# Patient Record
Sex: Male | Born: 1985 | Race: Black or African American | Hispanic: No | Marital: Single | Smoking: Current some day smoker
Health system: Southern US, Community
[De-identification: ages and names within clinical notes are randomized; demographics above are authoritative.]

## PROBLEM LIST (undated history)

## (undated) DIAGNOSIS — F4310 Post-traumatic stress disorder, unspecified: Secondary | ICD-10-CM

## (undated) DIAGNOSIS — I10 Essential (primary) hypertension: Secondary | ICD-10-CM

## (undated) DIAGNOSIS — R42 Dizziness and giddiness: Secondary | ICD-10-CM

---

## 2005-01-07 ENCOUNTER — Emergency Department (HOSPITAL_COMMUNITY): Admission: EM | Admit: 2005-01-07 | Discharge: 2005-01-08 | Payer: Self-pay | Admitting: Emergency Medicine
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
## 2008-02-27 ENCOUNTER — Emergency Department (HOSPITAL_COMMUNITY): Admission: EM | Admit: 2008-02-27 | Discharge: 2008-02-27 | Payer: Self-pay | Admitting: Emergency Medicine

## 2008-04-01 ENCOUNTER — Emergency Department (HOSPITAL_COMMUNITY): Admission: EM | Admit: 2008-04-01 | Discharge: 2008-04-01 | Payer: Self-pay | Admitting: Emergency Medicine

## 2008-04-18 ENCOUNTER — Emergency Department (HOSPITAL_COMMUNITY): Admission: EM | Admit: 2008-04-18 | Discharge: 2008-04-18 | Payer: Self-pay | Admitting: Emergency Medicine

## 2008-05-07 ENCOUNTER — Emergency Department (HOSPITAL_COMMUNITY): Admission: EM | Admit: 2008-05-07 | Discharge: 2008-05-07 | Payer: Self-pay | Admitting: Emergency Medicine

## 2008-11-21 ENCOUNTER — Emergency Department (HOSPITAL_COMMUNITY): Admission: EM | Admit: 2008-11-21 | Discharge: 2008-11-21 | Payer: Self-pay | Admitting: Family Medicine

## 2008-11-21 ENCOUNTER — Emergency Department (HOSPITAL_COMMUNITY): Admission: EM | Admit: 2008-11-21 | Discharge: 2008-11-21 | Payer: Self-pay | Admitting: Emergency Medicine

## 2009-02-18 ENCOUNTER — Emergency Department (HOSPITAL_COMMUNITY): Admission: EM | Admit: 2009-02-18 | Discharge: 2009-02-18 | Payer: Self-pay | Admitting: Emergency Medicine

## 2009-03-13 ENCOUNTER — Emergency Department (HOSPITAL_COMMUNITY): Admission: EM | Admit: 2009-03-13 | Discharge: 2009-03-13 | Payer: Self-pay | Admitting: Emergency Medicine

## 2009-08-23 ENCOUNTER — Emergency Department (HOSPITAL_COMMUNITY): Admission: EM | Admit: 2009-08-23 | Discharge: 2009-08-23 | Payer: Self-pay | Admitting: Family Medicine

## 2010-02-19 ENCOUNTER — Encounter: Payer: Self-pay | Admitting: Emergency Medicine

## 2010-05-04 LAB — COMPREHENSIVE METABOLIC PANEL
BUN: 10 mg/dL (ref 6–23)
CO2: 28 mEq/L (ref 19–32)
Calcium: 9 mg/dL (ref 8.4–10.5)
Creatinine, Ser: 1.07 mg/dL (ref 0.4–1.5)
GFR calc non Af Amer: >60 mL/min (ref >60)
Glucose, Bld: 86 mg/dL (ref 70–99)
Sodium: 143 mEq/L (ref 135–145)
Total Protein: 7.1 g/dL (ref 6.0–8.3)

## 2010-05-04 LAB — CBC
Hemoglobin: 14.4 g/dL (ref 13.0–17.0)
MCHC: 33.4 g/dL (ref 30.0–36.0)
MCV: 82.6 fL (ref 78.0–100.0)
RBC: 5.24 MIL/uL (ref 4.22–5.81)
RDW: 13.1 % (ref 11.5–15.5)

## 2010-05-04 LAB — DIFFERENTIAL
Eosinophils Absolute: 0 10*3/uL (ref 0.0–0.7)
Lymphocytes Relative: 26 % (ref 12–46)
Lymphs Abs: 1.9 10*3/uL (ref 0.7–4.0)
Monocytes Relative: 7 % (ref 3–12)
Neutro Abs: 4.8 10*3/uL (ref 1.7–7.7)
Neutrophils Relative %: 66 % (ref 43–77)

## 2010-05-04 LAB — LIPASE, BLOOD: Lipase: 13 U/L (ref 11–59)

## 2010-05-11 LAB — COMPREHENSIVE METABOLIC PANEL
ALT: 20 U/L (ref 0–53)
Albumin: 4.2 g/dL (ref 3.5–5.2)
Alkaline Phosphatase: 43 U/L (ref 39–117)
BUN: 13 mg/dL (ref 6–23)
Chloride: 106 mEq/L (ref 96–112)
Glucose, Bld: 95 mg/dL (ref 70–99)
Potassium: 4 mEq/L (ref 3.5–5.1)
Sodium: 141 mEq/L (ref 135–145)
Total Bilirubin: 0.7 mg/dL (ref 0.3–1.2)
Total Protein: 7.1 g/dL (ref 6.0–8.3)

## 2010-05-11 LAB — DIFFERENTIAL
Basophils Absolute: 0 10*3/uL (ref 0.0–0.1)
Basophils Relative: 0 % (ref 0–1)
Eosinophils Absolute: 0 10*3/uL (ref 0.0–0.7)
Monocytes Absolute: 0.5 10*3/uL (ref 0.1–1.0)
Monocytes Relative: 7 % (ref 3–12)
Neutro Abs: 4.9 10*3/uL (ref 1.7–7.7)
Neutrophils Relative %: 68 % (ref 43–77)

## 2010-05-11 LAB — CBC
HCT: 41.2 % (ref 39.0–52.0)
Hemoglobin: 14 g/dL (ref 13.0–17.0)
RDW: 13 % (ref 11.5–15.5)
WBC: 7.2 10*3/uL (ref 4.0–10.5)

## 2010-05-11 LAB — URINALYSIS, ROUTINE W REFLEX MICROSCOPIC
Glucose, UA: NEGATIVE mg/dL
Nitrite: NEGATIVE
Specific Gravity, Urine: 1.034 — ABNORMAL HIGH (ref 1.005–1.030)
pH: 6 (ref 5.0–8.0)

## 2010-05-11 LAB — RAPID URINE DRUG SCREEN, HOSP PERFORMED: Tetrahydrocannabinol: NOT DETECTED

## 2010-05-11 LAB — ETHANOL: Alcohol, Ethyl (B): 10 mg/dL (ref 0–10)

## 2010-12-02 ENCOUNTER — Emergency Department (HOSPITAL_COMMUNITY)

## 2010-12-02 ENCOUNTER — Emergency Department (HOSPITAL_COMMUNITY)
Admission: EM | Admit: 2010-12-02 | Discharge: 2010-12-02 | Disposition: A | Discharge status: Home / Self Care | Attending: Emergency Medicine | Admitting: Emergency Medicine

## 2010-12-02 DIAGNOSIS — S335XXA Sprain of ligaments of lumbar spine, initial encounter: Secondary | ICD-10-CM | POA: Insufficient documentation

## 2010-12-02 DIAGNOSIS — R112 Nausea with vomiting, unspecified: Secondary | ICD-10-CM | POA: Insufficient documentation

## 2010-12-02 DIAGNOSIS — X503XXA Overexertion from repetitive movements, initial encounter: Secondary | ICD-10-CM | POA: Insufficient documentation

## 2010-12-02 DIAGNOSIS — Y93B3 Activity, free weights: Secondary | ICD-10-CM | POA: Insufficient documentation

## 2010-12-02 DIAGNOSIS — M545 Low back pain, unspecified: Secondary | ICD-10-CM | POA: Insufficient documentation

## 2010-12-02 DIAGNOSIS — R197 Diarrhea, unspecified: Secondary | ICD-10-CM | POA: Insufficient documentation

## 2010-12-02 LAB — POCT I-STAT, CHEM 8
BUN: 14 mg/dL (ref 6–23)
Chloride: 104 mEq/L (ref 96–112)
HCT: 46 % (ref 39.0–52.0)
Potassium: 3.6 mEq/L (ref 3.5–5.1)
Sodium: 141 mEq/L (ref 135–145)

## 2010-12-02 LAB — URINALYSIS, ROUTINE W REFLEX MICROSCOPIC
Bilirubin Urine: NEGATIVE
Glucose, UA: NEGATIVE mg/dL
Leukocytes, UA: NEGATIVE
Nitrite: NEGATIVE
Specific Gravity, Urine: 1.012 (ref 1.005–1.030)
pH: 5.5 (ref 5.0–8.0)

## 2010-12-02 LAB — CBC
Hemoglobin: 14.6 g/dL (ref 13.0–17.0)
MCV: 80.8 fL (ref 78.0–100.0)
Platelets: 247 10*3/uL (ref 150–400)
RBC: 5.53 MIL/uL (ref 4.22–5.81)
WBC: 13.5 10*3/uL — ABNORMAL HIGH (ref 4.0–10.5)

## 2010-12-02 LAB — DIFFERENTIAL
Eosinophils Absolute: 0.1 10*3/uL (ref 0.0–0.7)
Lymphocytes Relative: 23 % (ref 12–46)
Lymphs Abs: 3.1 10*3/uL (ref 0.7–4.0)
Neutro Abs: 9.3 10*3/uL — ABNORMAL HIGH (ref 1.7–7.7)
Neutrophils Relative %: 68 % (ref 43–77)

## 2010-12-03 LAB — URINE CULTURE: Culture  Setup Time: 201211031354

## 2011-03-04 ENCOUNTER — Encounter (HOSPITAL_COMMUNITY): Payer: Self-pay | Admitting: Emergency Medicine

## 2011-03-04 ENCOUNTER — Emergency Department (HOSPITAL_COMMUNITY)
Admission: EM | Admit: 2011-03-04 | Discharge: 2011-03-04 | Disposition: A | Discharge status: Home / Self Care | Attending: Emergency Medicine | Admitting: Emergency Medicine

## 2011-03-04 DIAGNOSIS — R197 Diarrhea, unspecified: Secondary | ICD-10-CM | POA: Insufficient documentation

## 2011-03-04 DIAGNOSIS — R112 Nausea with vomiting, unspecified: Secondary | ICD-10-CM | POA: Insufficient documentation

## 2011-03-04 DIAGNOSIS — K5289 Other specified noninfective gastroenteritis and colitis: Secondary | ICD-10-CM | POA: Insufficient documentation

## 2011-03-04 DIAGNOSIS — R109 Unspecified abdominal pain: Secondary | ICD-10-CM | POA: Insufficient documentation

## 2011-03-04 DIAGNOSIS — K529 Noninfective gastroenteritis and colitis, unspecified: Secondary | ICD-10-CM

## 2011-03-04 MED ORDER — PROMETHAZINE HCL 25 MG PO TABS: 25.0000 mg | ORAL_TABLET | Freq: Four times a day (QID) | ORAL | Status: DC | PRN

## 2011-03-04 MED ORDER — SODIUM CHLORIDE 0.9 % IV SOLN: INTRAVENOUS | Status: DC

## 2011-03-04 MED ORDER — SODIUM CHLORIDE 0.9 % IV BOLUS (SEPSIS)
1000.0000 mL | Freq: Once | INTRAVENOUS | Status: AC
  Administered 2011-03-04: 1000 mL via INTRAVENOUS

## 2011-03-04 MED ORDER — ONDANSETRON HCL 4 MG/2ML IJ SOLN
4.0000 mg | Freq: Once | INTRAMUSCULAR | Status: AC
  Administered 2011-03-04: 4 mg via INTRAVENOUS
  Filled 2011-03-04: qty 2

## 2011-03-04 MED ORDER — LOPERAMIDE HCL 2 MG PO TABS: 2.0000 mg | ORAL_TABLET | Freq: Four times a day (QID) | ORAL | Status: AC | PRN

## 2011-03-04 NOTE — ED Notes (Signed)
Pt states understanding of discharge instructions 

## 2011-03-04 NOTE — ED Notes (Signed)
Patient states that he has not been feeling good for the last 2-3 days (i.e. Body aches, headache, cough, nasal congestion); patient reports right sided abdominal pain, nausea, and vomiting since 2300 last night -- patient reports possible blood present in vomit.  Reports normal bowel movements; last bowel movement at 0400.

## 2011-03-04 NOTE — ED Provider Notes (Signed)
History     CSN: [id number]  Arrival date & time 03/04/11  4098   First MD Initiated Contact with Patient 03/04/11 [phone]      Chief Complaint  Patient presents with  . Emesis    (Consider location/radiation/quality/duration/timing/severity/associated sxs/prior treatment) Patient is a 26 y.o. male presenting with vomiting. The history is provided by the patient.  Emesis  This is a new problem. The current episode started 3 to 5 hours ago. The problem occurs 2 to 4 times per day. The problem has not changed since onset.The emesis has an appearance of stomach contents. There has been no fever. Associated symptoms include abdominal pain and diarrhea. Pertinent negatives include no chills, no fever, no headaches and no myalgias. Risk factors include ill contacts.   Patient also with diarrhea about 4 times same onset time as the vomiting which all occurred at 11:00 PM.  History reviewed. No pertinent past medical history.  History reviewed. No pertinent past surgical history.  History reviewed. No pertinent family history.  History  Substance Use Topics  . Smoking status: Not on file  . Smokeless tobacco: Not on file  . Alcohol Use: Not on file      Review of Systems  Constitutional: Negative for fever and chills.  HENT: Negative for neck pain.   Eyes: Negative for redness.  Respiratory: Negative for shortness of breath.   Cardiovascular: Negative for chest pain.  Gastrointestinal: Positive for nausea, vomiting, abdominal pain and diarrhea.  Genitourinary: Negative for dysuria.  Musculoskeletal: Negative for myalgias and back pain.  Neurological: Negative for headaches.    Allergies  Review of patient's allergies indicates no known allergies.  Home Medications   Current Outpatient Rx  Name Route Sig Dispense Refill  . LOPERAMIDE HCL 2 MG PO TABS Oral Take 1 tablet (2 mg total) by mouth 4 (four) times daily as needed for diarrhea or loose stools. 30 tablet 0  .  PROMETHAZINE HCL 25 MG PO TABS Oral Take 1 tablet (25 mg total) by mouth every 6 (six) hours as needed for nausea. 12 tablet 0    BP 112/74  Pulse 50  Temp(Src) 98 F (36.7 C) (Oral)  Resp 18  SpO2 98%  Physical Exam  Nursing note and vitals reviewed. Constitutional: He is oriented to person, place, and time. He appears well-developed and well-nourished.  HENT:  Head: Normocephalic and atraumatic.  Mouth/Throat: Oropharynx is clear and moist.  Eyes: Conjunctivae and EOM are normal. Pupils are equal, round, and reactive to light.  Neck: Normal range of motion. Neck supple.  Cardiovascular: Normal rate, regular rhythm and normal heart sounds.   No murmur heard. Pulmonary/Chest: Effort normal and breath sounds normal. No respiratory distress.  Abdominal: Soft. Bowel sounds are normal. There is no tenderness.  Musculoskeletal: Normal range of motion.  Neurological: He is alert and oriented to person, place, and time. No cranial nerve deficit. He exhibits normal muscle tone. Coordination normal.  Skin: Skin is warm. No rash noted.    ED Course  Procedures (including critical care time)  Labs Reviewed - No data to display No results found.   1. Gastroenteritis       MDM   Clinically symptoms consistent with viral gastroenteritis acute onset of vomiting and diarrhea 3-4 episodes of each. Improved in emergency department with 1 L IV normal saline fluid bolus and Zofran. Recheck of abdomen shows no abdominal tenderness no guarding, doubt acute abdomen  Patient with a sick contact in his neice that had  a similar illness.     Shelda Jakes, MD 03/04/11 [phone]

## 2011-12-19 ENCOUNTER — Encounter (HOSPITAL_COMMUNITY): Payer: Self-pay

## 2011-12-19 ENCOUNTER — Emergency Department (INDEPENDENT_AMBULATORY_CARE_PROVIDER_SITE_OTHER)
Admission: EM | Admit: 2011-12-19 | Discharge: 2011-12-19 | Disposition: A | Discharge status: Home / Self Care | Payer: Self-pay | Source: Home / Self Care | Attending: Emergency Medicine | Admitting: Emergency Medicine

## 2011-12-19 DIAGNOSIS — S239XXA Sprain of unspecified parts of thorax, initial encounter: Secondary | ICD-10-CM

## 2011-12-19 DIAGNOSIS — IMO0002 Reserved for concepts with insufficient information to code with codable children: Secondary | ICD-10-CM

## 2011-12-19 MED ORDER — DICLOFENAC SODIUM 75 MG PO TBEC: 75.0000 mg | DELAYED_RELEASE_TABLET | Freq: Two times a day (BID) | ORAL | Status: DC

## 2011-12-19 MED ORDER — METHOCARBAMOL 500 MG PO TABS: 500.0000 mg | ORAL_TABLET | Freq: Three times a day (TID) | ORAL | Status: DC

## 2011-12-19 MED ORDER — TRAMADOL HCL 50 MG PO TABS: 100.0000 mg | ORAL_TABLET | Freq: Three times a day (TID) | ORAL | Status: DC | PRN

## 2011-12-19 NOTE — ED Provider Notes (Signed)
Chief Complaint  Patient presents with  . Back Pain    History of Present Illness:   The patient is a 26 year old male who has had a one half week history of mid back pain localized just below his left shoulder blade. He denies any injury. 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 The pain is rated 5/10 in intensity. It's worse if she lifts her stretches and better with a hot shower. There is no radiation down the leg, no numbness, tingling, or weakness of the upper or lower extremities. He denies any pain with deep inspiration. He has had no urinary symptoms, dysuria, frequency, or hematuria. There is no incontinence of bladder or bowel. No saddle anesthesia. He denies any fever, weight loss, or history of cancer. There is no shortness of breath, coughing, or pleuritic pain.  Review of Systems:  Other than noted above, the patient denies any of the following symptoms: Systemic:  No fever, chills, severe fatigue, or unexplained weight loss. GI:  No abdominal pain, nausea, vomiting, diarrhea, constipation, incontinence of bowel, or blood in stool. GU:  No dysuria, frequency, urgency, or hematuria. No incontinence of urine or difficulty urinating.  M-S:  No neck pain, joint pain, arthritis, or myalgias. Neuro:  No paresthesias, saddle anesthesia, muscular weakness, or progressive neurological deficit.  PMFSH:  Past medical history, family history, social history, meds, and allergies were reviewed. Specifically, there is no history of cancer, major trauma, osteoporosis, immunosuppression, HIV, or IV or injection drug use.   Physical Exam:   Vital signs:  BP 121/57  Pulse 61  Temp 98.3 F (36.8 C) (Oral)  Resp 10  SpO2 97% General:  Alert, oriented, in no distress. Abdomen:  Soft, non-tender.  No organomegaly or mass.  No pulsatile midline abdominal mass or bruit. Back:  There is mild pain to palpation just below the tip of his left scapula on the lower back. His back  has a full range of motion with slight pain on forward bending. Straight leg raising was negative. Neuro:  Normal muscle strength, sensations and DTRs. Extremities: Pedal pulses were full, there was no edema. Skin:  Clear, warm and dry.  No rash.     Assessment:  The encounter diagnosis was Thoracic sprain and strain.  Plan:   1.  The following meds were prescribed:   New Prescriptions   DICLOFENAC (VOLTAREN) 75 MG EC TABLET    Take 1 tablet (75 mg total) by mouth 2 (two) times daily.   METHOCARBAMOL (ROBAXIN) 500 MG TABLET    Take 1 tablet (500 mg total) by mouth 3 (three) times daily.   TRAMADOL (ULTRAM) 50 MG TABLET    Take 2 tablets (100 mg total) by mouth every 8 (eight) hours as needed for pain.   2.  The patient was instructed in symptomatic care and handouts were given. 3.  The patient was told to return if becoming worse in any way, if no better in 2 weeks, and given some red flag symptoms that would indicate earlier return. 4.  The patient was encouraged to try to be as active as possible and given some exercises to do followed by moist heat.    Reuben Likes, MD 12/19/11 [phone]

## 2011-12-19 NOTE — ED Notes (Addendum)
Pain in mid back x 2 weeks; no known injury; pain worse w movement. Denies UA symptoms, pain unchanged w percussion

## 2012-08-12 ENCOUNTER — Emergency Department (INDEPENDENT_AMBULATORY_CARE_PROVIDER_SITE_OTHER)
Admission: EM | Admit: 2012-08-12 | Discharge: 2012-08-12 | Disposition: A | Discharge status: Home / Self Care | Payer: Self-pay | Source: Home / Self Care | Attending: Emergency Medicine | Admitting: Emergency Medicine

## 2012-08-12 ENCOUNTER — Encounter (HOSPITAL_COMMUNITY): Payer: Self-pay | Admitting: Emergency Medicine

## 2012-08-12 DIAGNOSIS — A088 Other specified intestinal infections: Secondary | ICD-10-CM

## 2012-08-12 DIAGNOSIS — A084 Viral intestinal infection, unspecified: Secondary | ICD-10-CM

## 2012-08-12 HISTORY — DX: Dizziness and giddiness: R42

## 2012-08-12 LAB — CBC WITH DIFFERENTIAL/PLATELET
Basophils Absolute: 0 10*3/uL (ref 0.0–0.1)
Basophils Relative: 0 % (ref 0–1)
Eosinophils Absolute: 0 10*3/uL (ref 0.0–0.7)
Eosinophils Relative: 0 % (ref 0–5)
HCT: 45.4 % (ref 39.0–52.0)
Lymphocytes Relative: 20 % (ref 12–46)
MCHC: 33.7 g/dL (ref 30.0–36.0)
MCV: 79.1 fL (ref 78.0–100.0)
Monocytes Absolute: 0.5 10*3/uL (ref 0.1–1.0)
Platelets: 223 10*3/uL (ref 150–400)
RDW: 12.9 % (ref 11.5–15.5)
WBC: 7.8 10*3/uL (ref 4.0–10.5)

## 2012-08-12 LAB — POCT I-STAT, CHEM 8
Calcium, Ion: 1.2 mmol/L (ref 1.12–1.23)
HCT: 50 % (ref 39.0–52.0)
Hemoglobin: 17 g/dL (ref 13.0–17.0)
Sodium: 142 mEq/L (ref 135–145)
TCO2: 27 mmol/L (ref 0–100)

## 2012-08-12 MED ORDER — GI COCKTAIL ~~LOC~~
30.0000 mL | Freq: Once | ORAL | Status: AC
  Administered 2012-08-12: 30 mL via ORAL

## 2012-08-12 MED ORDER — SODIUM CHLORIDE 0.9 % IV SOLN
INTRAVENOUS | Status: DC
  Administered 2012-08-12: 13:00:00 via INTRAVENOUS

## 2012-08-12 MED ORDER — GI COCKTAIL ~~LOC~~
ORAL | Status: AC
  Filled 2012-08-12: qty 30

## 2012-08-12 MED ORDER — ONDANSETRON 4 MG PO TBDP
8.0000 mg | ORAL_TABLET | Freq: Once | ORAL | Status: AC
  Administered 2012-08-12: 8 mg via ORAL

## 2012-08-12 MED ORDER — ONDANSETRON 8 MG PO TBDP: 8.0000 mg | ORAL_TABLET | Freq: Three times a day (TID) | ORAL | Status: DC | PRN

## 2012-08-12 MED ORDER — OXYCODONE-ACETAMINOPHEN 5-325 MG PO TABS: ORAL_TABLET | ORAL | Status: DC

## 2012-08-12 MED ORDER — ONDANSETRON 4 MG PO TBDP
ORAL_TABLET | ORAL | Status: AC
  Filled 2012-08-12: qty 2

## 2012-08-12 MED ORDER — MECLIZINE HCL 25 MG PO TABS: 25.0000 mg | ORAL_TABLET | Freq: Four times a day (QID) | ORAL | Status: DC

## 2012-08-12 NOTE — ED Notes (Signed)
repositioned and provided pillow

## 2012-08-12 NOTE — ED Provider Notes (Signed)
Chief Complaint:   Chief Complaint  Patient presents with  . Emesis    History of Present Illness:   Tyler Ellison is a 27 year old male who has had a history since yesterday of crampy right upper quadrant abdominal pain, nausea, and vomiting. He vomited 4 times yesterday and twice today. No blood in the vomitus or coffee-ground emesis. The vomitus was somewhat yellow. He had 2 diarrheal stools yesterday and none today. He also feels lightheaded, dizzy, off balance. He feels like his head is spinning this comes and goes. He denies any difficulty hearing or ringing in the ears. No URI symptoms. He's also had some headache and right ear pain. He denies any chest pain or shortness of breath. He has had a history of vertigo in the past. No suspicious ingestions.  Review of Systems:  Other than noted above, the patient denies any of the following symptoms: Systemic:  No fevers, chills, sweats, weight loss or gain, fatigue, or tiredness. ENT:  No nasal congestion, rhinorrhea, or sore throat. Lungs:  No cough, wheezing, or shortness of breath. Cardiac:  No chest pain, syncope, or presyncope. GI:  No abdominal pain, nausea, vomiting, anorexia, diarrhea, constipation, blood in stool or vomitus. GU:  No dysuria, frequency, or urgency.  PMFSH:  Past medical history, family history, social history, meds, and allergies were reviewed.   Physical Exam:   Vital signs:  BP 125/87  Pulse 78  Temp(Src) 98.1 F (36.7 C) (Oral)  Resp 20  SpO2 97% Filed Vitals:   08/12/12 1150 Supine  08/12/12 1151 Sitting  08/12/12 1153 Standing   BP: 122/62 122/86 125/87  Pulse: 57 72 78  Temp: 98.1 F (36.7 C)    TempSrc: Oral    Resp: 20    SpO2: 97%      General:  Alert and oriented.  In no distress.  Skin warm and dry.  Good skin turgor, brisk capillary refill. ENT:  No scleral icterus, moist mucous membranes, no oral lesions, pharynx clear. Lungs:  Breath sounds clear and equal bilaterally.  No wheezes,  rales, or rhonchi. Heart:  Rhythm regular, without extrasystoles.  No gallops or murmers. Abdomen:  Soft, flat, nondistended. He has slight right upper quadrant tenderness to palpation without guarding or rebound. No organomegaly or mass. Murphy sign and Murphy's punch were negative. Bowel sounds are hyperactive. Skin: Clear, warm, and dry.  Good turgor.  Brisk capillary refill.  Labs:   Results for orders placed during the hospital encounter of 08/12/12  CBC WITH DIFFERENTIAL      Result Value Range   WBC 7.8  4.0 - 10.5 K/uL   RBC 5.74  4.22 - 5.81 MIL/uL   Hemoglobin 15.3  13.0 - 17.0 g/dL   HCT 16.1  09.6 - 04.5 %   MCV 79.1  78.0 - 100.0 fL   MCH 26.7  26.0 - 34.0 pg   MCHC 33.7  30.0 - 36.0 g/dL   RDW 40.9  81.1 - 91.4 %   Platelets 223  150 - 400 K/uL   Neutrophils Relative % 73  43 - 77 %   Neutro Abs 5.7  1.7 - 7.7 K/uL   Lymphocytes Relative 20  12 - 46 %   Lymphs Abs 1.6  0.7 - 4.0 K/uL   Monocytes Relative 6  3 - 12 %   Monocytes Absolute 0.5  0.1 - 1.0 K/uL   Eosinophils Relative 0  0 - 5 %   Eosinophils Absolute 0.0  0.0 -  0.7 K/uL   Basophils Relative 0  0 - 1 %   Basophils Absolute 0.0  0.0 - 0.1 K/uL  POCT I-STAT, CHEM 8      Result Value Range   Sodium 142  135 - 145 mEq/L   Potassium 4.0  3.5 - 5.1 mEq/L   Chloride 101  96 - 112 mEq/L   BUN 13  6 - 23 mg/dL   Creatinine, Ser 2.72  0.50 - 1.35 mg/dL   Glucose, Bld 95  70 - 99 mg/dL   Calcium, Ion 5.36  1.12 - 1.23 mmol/L   TCO2 27  0 - 100 mmol/L   Hemoglobin 17.0  13.0 - 17.0 g/dL   HCT 64.4  03.4 - 74.2 %     Course in Urgent Care Center:   Since he was mildly orthostatic, was given normal saline 1 L over about 45 minutes. Also given Zofran ODT 8 mg sublingually and a GI cocktail. Thereafter he felt better and his pain was almost completely gone. Given some Gatorade by mouth and was able to tolerate this well.  Assessment:  The encounter diagnosis was Viral gastroenteritis.  Appears to have  a viral  gastroenteritis with mild dehydration as demonstrated by some postural increase in his pulse. He felt better after IV fluids and Zofran. He is able to tolerate oral liquids well.  Plan:   1.  The following meds were prescribed:   Discharge Medication List as of 08/12/2012  1:57 PM    START taking these medications   Details  meclizine (ANTIVERT) 25 MG tablet Take 1 tablet (25 mg total) by mouth 4 (four) times daily., Starting 08/12/2012, Until Discontinued, Normal    ondansetron (ZOFRAN ODT) 8 MG disintegrating tablet Take 1 tablet (8 mg total) by mouth every 8 (eight) hours as needed for nausea., Starting 08/12/2012, Until Discontinued, Normal    oxyCODONE-acetaminophen (PERCOCET) 5-325 MG per tablet 1 to 2 tablets every 6 hours as needed for pain., Print       2.  The patient was instructed in symptomatic care and handouts were given. 3.  The patient was told to return if becoming worse in any way, if no better in 2 or 3 days, and given some red flag symptoms such as persistent vomiting or increasing pain that would indicate earlier return. 4.  The patient was told to take only sips of clear liquids for the next 24 hours and then advance to a b.r.a.t. Diet. 5.  Follow up here if necessary.      Reuben Likes, MD 08/12/12 [phone]

## 2012-08-12 NOTE — ED Notes (Signed)
Vomiting, abdominal pain, lightheaded and dizziness.  Onset of symptoms yesterday morning.  4 epoisodes of vomiting yesterday, 2 episodes today.

## 2012-08-12 NOTE — ED Notes (Signed)
Desiree, cma attempted iv twice, unsuccessful

## 2012-08-27 ENCOUNTER — Encounter (HOSPITAL_COMMUNITY): Payer: Self-pay | Admitting: Emergency Medicine

## 2012-08-27 ENCOUNTER — Emergency Department (INDEPENDENT_AMBULATORY_CARE_PROVIDER_SITE_OTHER)
Admission: EM | Admit: 2012-08-27 | Discharge: 2012-08-27 | Disposition: A | Discharge status: Home / Self Care | Payer: Self-pay | Source: Home / Self Care

## 2012-08-27 DIAGNOSIS — S335XXA Sprain of ligaments of lumbar spine, initial encounter: Secondary | ICD-10-CM

## 2012-08-27 DIAGNOSIS — S39012A Strain of muscle, fascia and tendon of lower back, initial encounter: Secondary | ICD-10-CM

## 2012-08-27 LAB — POCT URINALYSIS DIP (DEVICE)
Bilirubin Urine: NEGATIVE
Glucose, UA: NEGATIVE mg/dL
Hgb urine dipstick: NEGATIVE
Leukocytes, UA: NEGATIVE
Nitrite: NEGATIVE
Urobilinogen, UA: 0.2 mg/dL (ref 0.0–1.0)

## 2012-08-27 MED ORDER — TRAMADOL HCL 50 MG PO TABS: 50.0000 mg | ORAL_TABLET | Freq: Four times a day (QID) | ORAL | Status: DC | PRN

## 2012-08-27 MED ORDER — NAPROXEN 500 MG PO TABS: 500.0000 mg | ORAL_TABLET | Freq: Two times a day (BID) | ORAL | Status: DC

## 2012-08-27 NOTE — ED Notes (Signed)
Pt c/o lower back pain x 2 days. Pt reports he works out a lot and plays basketball. Feels like he may have pulled a muscle. Has been using advil for pain with mild relief. Feels better with stretching. Pt denies urinary problems. Bowel movements are normal. Pt is alert and oriented.

## 2012-08-27 NOTE — ED Provider Notes (Signed)
Medical screening examination/treatment/procedure(s) were performed by non-physician practitioner and as supervising physician I was immediately available for consultation/collaboration.  Raynald Blend, MD 08/27/12 [phone]

## 2012-08-27 NOTE — ED Provider Notes (Signed)
CSN: [id number]     Arrival date & time 08/27/12  1017 History     First MD Initiated Contact with Patient 08/27/12 1111     Chief Complaint  Patient presents with  . Back Pain   (Consider location/radiation/quality/duration/timing/severity/associated sxs/prior Treatment) HPI Comments: 27 year old male presents with pain in the right low back for "a few days". He arose from the couch and experienced moderate pain running down the right posterior back. The pain is localized to the lower thoracic and lumbar para musculature. The pain is not radiate down the hip of the leg. He describes it as mild and tolerable. He has been applying heat and has received massages which have helped. He states nothing makes it worse. He is able to be in school, lift, and demonstrate full range of motion of the spine with minimal to no discomfort. Forward flexion actually helped her to feel better. Stretching helps her to feel better. Denies problems with erection, urination,  incontinence, or extremity weakness or paresthesias. He is lying reclined on the table with relaxed posturing and playing on the telephone.    Past Medical History  Diagnosis Date  . Vertigo    History reviewed. No pertinent past surgical history. No family history on file. History  Substance Use Topics  . Smoking status: Current Every Day Smoker  . Smokeless tobacco: Not on file  . Alcohol Use: Yes    Review of Systems  Constitutional: Positive for activity change. Negative for fever and fatigue.  HENT: Negative.   Respiratory: Negative.   Gastrointestinal: Negative.   Genitourinary: Negative.   Musculoskeletal:       As per HPI  Skin: Negative.   Neurological: Negative for dizziness, weakness, numbness and headaches.    Allergies  Review of patient's allergies indicates no known allergies.  Home Medications   Current Outpatient Rx  Name  Route  Sig  Dispense  Refill  . diclofenac (VOLTAREN) 75 MG EC tablet   Oral  Take 1 tablet (75 mg total) by mouth 2 (two) times daily.   20 tablet   0   . ibuprofen (ADVIL,MOTRIN) 200 MG tablet   Oral   Take 200 mg by mouth every 6 (six) hours as needed for pain.         . meclizine (ANTIVERT) 25 MG tablet   Oral   Take 1 tablet (25 mg total) by mouth 4 (four) times daily.   28 tablet   0   . methocarbamol (ROBAXIN) 500 MG tablet   Oral   Take 1 tablet (500 mg total) by mouth 3 (three) times daily.   30 tablet   0   . naproxen (NAPROSYN) 500 MG tablet   Oral   Take 1 tablet (500 mg total) by mouth 2 (two) times daily.   20 tablet   0   . ondansetron (ZOFRAN ODT) 8 MG disintegrating tablet   Oral   Take 1 tablet (8 mg total) by mouth every 8 (eight) hours as needed for nausea.   20 tablet   0   . oxyCODONE-acetaminophen (PERCOCET) 5-325 MG per tablet      1 to 2 tablets every 6 hours as needed for pain.   20 tablet   0   . traMADol (ULTRAM) 50 MG tablet   Oral   Take 2 tablets (100 mg total) by mouth every 8 (eight) hours as needed for pain.   30 tablet   0   . traMADol (ULTRAM) 50 MG  tablet   Oral   Take 1 tablet (50 mg total) by mouth every 6 (six) hours as needed for pain.   15 tablet   0    BP 133/86  Pulse 60  Temp(Src) 97.2 F (36.2 C) (Oral)  Resp 14  SpO2 100% Physical Exam  Nursing note and vitals reviewed. Constitutional: He is oriented to person, place, and time. He appears well-developed and well-nourished. No distress.  HENT:  Head: Normocephalic and atraumatic.  Eyes: EOM are normal.  Neck: Normal range of motion. Neck supple.  Cardiovascular: Normal rate and normal heart sounds.   Pulmonary/Chest: Effort normal and breath sounds normal. No respiratory distress. He has no wheezes. He has no rales.  Musculoskeletal: Normal range of motion. He exhibits no edema.  Minor tenderness to the right lower parathoracic and paralumbar musculature. No swelling, discoloration or palpated muscular contractions representing  spasms.  Neurological: He is alert and oriented to person, place, and time. No cranial nerve deficit. He exhibits normal muscle tone.  Skin: Skin is warm and dry.  Psychiatric: He has a normal mood and affect.    ED Course   Procedures (including critical care time)  Labs Reviewed  POCT URINALYSIS DIP (DEVICE)   No results found. 1. Lumbar strain, initial encounter     MDM  Weight lifting for a couple weeks. Perform stretches as discussed. Continue to apply heat as directed. Ultram 50 milligrams Q6 hours when necessary pain Naprosyn EC 500 mg twice a day when necessary   Hayden Rasmussen, NP 08/27/12 1148

## 2012-09-01 ENCOUNTER — Emergency Department (INDEPENDENT_AMBULATORY_CARE_PROVIDER_SITE_OTHER)
Admission: EM | Admit: 2012-09-01 | Discharge: 2012-09-01 | Disposition: A | Discharge status: Home / Self Care | Payer: Self-pay | Source: Home / Self Care | Attending: Emergency Medicine | Admitting: Emergency Medicine

## 2012-09-01 ENCOUNTER — Encounter (HOSPITAL_COMMUNITY): Payer: Self-pay | Admitting: Emergency Medicine

## 2012-09-01 ENCOUNTER — Other Ambulatory Visit (HOSPITAL_COMMUNITY)
Admission: RE | Admit: 2012-09-01 | Discharge: 2012-09-01 | Disposition: A | Discharge status: Home / Self Care | Payer: Self-pay | Source: Ambulatory Visit | Attending: Emergency Medicine | Admitting: Emergency Medicine

## 2012-09-01 DIAGNOSIS — A088 Other specified intestinal infections: Secondary | ICD-10-CM

## 2012-09-01 DIAGNOSIS — R300 Dysuria: Secondary | ICD-10-CM

## 2012-09-01 DIAGNOSIS — S39012D Strain of muscle, fascia and tendon of lower back, subsequent encounter: Secondary | ICD-10-CM

## 2012-09-01 DIAGNOSIS — Z113 Encounter for screening for infections with a predominantly sexual mode of transmission: Secondary | ICD-10-CM | POA: Insufficient documentation

## 2012-09-01 LAB — POCT URINALYSIS DIP (DEVICE)
Hgb urine dipstick: NEGATIVE
Ketones, ur: NEGATIVE mg/dL
Protein, ur: NEGATIVE mg/dL
Specific Gravity, Urine: 1.02 (ref 1.005–1.030)
Urobilinogen, UA: 0.2 mg/dL (ref 0.0–1.0)

## 2012-09-01 MED ORDER — AZITHROMYCIN 250 MG PO TABS
ORAL_TABLET | ORAL | Status: AC
  Filled 2012-09-01: qty 4

## 2012-09-01 MED ORDER — CYCLOBENZAPRINE HCL 5 MG PO TABS: 5.0000 mg | ORAL_TABLET | Freq: Three times a day (TID) | ORAL | Status: DC | PRN

## 2012-09-01 MED ORDER — LIDOCAINE HCL (PF) 1 % IJ SOLN
INTRAMUSCULAR | Status: AC
  Filled 2012-09-01: qty 5

## 2012-09-01 MED ORDER — CEFTRIAXONE SODIUM 250 MG IJ SOLR
INTRAMUSCULAR | Status: AC
  Filled 2012-09-01: qty 250

## 2012-09-01 MED ORDER — CEFTRIAXONE SODIUM 250 MG IJ SOLR
250.0000 mg | Freq: Once | INTRAMUSCULAR | Status: AC
  Administered 2012-09-01: 250 mg via INTRAMUSCULAR

## 2012-09-01 MED ORDER — AZITHROMYCIN 250 MG PO TABS
1000.0000 mg | ORAL_TABLET | Freq: Once | ORAL | Status: AC
  Administered 2012-09-01: 1000 mg via ORAL

## 2012-09-01 MED ORDER — OXYCODONE-ACETAMINOPHEN 5-325 MG PO TABS: ORAL_TABLET | ORAL | Status: DC

## 2012-09-01 MED ORDER — MELOXICAM 15 MG PO TABS: 15.0000 mg | ORAL_TABLET | Freq: Every day | ORAL | Status: DC

## 2012-09-01 NOTE — ED Notes (Signed)
C/o continuing back from 7/30. C/o discomfort with urination onset yesterday AM- woke from sleep to urinate and felt pain. He noted blood in his urine today.  No penile discharge. Has noted wetness on his underwear but thought it was urine.

## 2012-09-01 NOTE — ED Provider Notes (Signed)
Chief Complaint:   Chief Complaint  Patient presents with  . Back Pain    History of Present Illness:   Tyler Ellison is a 27 year old male who was seen here about 5 days ago for lower back pain. He had gotten up from a couch, and experienced pain in the lower back without radiation he was given muscle relaxers, anti-inflammatories, and pain meds, but states today he is no better. The pain does not radiate. There is no numbness, tingling, weakness in lower extremities. It's located in the lower back bilaterally. It radiates around the sides and he's had some stomach cramps. Also today he had dysuria and noted that possibly some brown spots in his urine. He felt somewhat chilled and nauseated and vomited once. He's had several loose stools. He denies any urethral discharge.  Review of Systems:  Other than noted above, the patient denies any of the following symptoms: Systemic:  No fever, chills, severe fatigue, or unexplained weight loss. GI:  No abdominal pain, nausea, vomiting, diarrhea, constipation, incontinence of bowel, or blood in stool. GU:  No dysuria, frequency, urgency, or hematuria. No incontinence of urine or difficulty urinating.  M-S:  No neck pain, joint pain, arthritis, or myalgias. Neuro:  No paresthesias, saddle anesthesia, muscular weakness, or progressive neurological deficit.  PMFSH:  Past medical history, family history, social history, meds, and allergies were reviewed. Specifically, there is no history of cancer, major trauma, osteoporosis, immunosuppression, or HIV infection.   Physical Exam:   Vital signs:  BP 134/73  Pulse 70  Temp(Src) 98.3 F (36.8 C) (Oral)  Resp 14  SpO2 100% General:  Alert, oriented, in no distress. Lungs: Clear to auscultation. Heart: Regular rhythm no gallop or murmur. Abdomen:  Soft, with mild generalized tenderness to palpation without guarding or rebound, bowel sounds are normally active.  No organomegaly or mass.  No pulsatile midline  abdominal mass or bruit. Genital exam: No urethral discharge, no penile lesions or testicular tenderness. No ankle adenopathy or hernia. Back:  He has mild CVA tenderness and mild lumbar paravertebral and midline tenderness to palpation. His back has a limited range of motion with pain. Straight leg raising was negative. Neuro:  Normal muscle strength, sensations and DTRs. Extremities: Pedal pulses were full, there was no edema. Skin:  Clear, warm and dry.  No rash.  Labs:   Results for orders placed during the hospital encounter of 09/01/12  POCT URINALYSIS DIP (DEVICE)      Result Value Range   Glucose, UA NEGATIVE  NEGATIVE mg/dL   Bilirubin Urine NEGATIVE  NEGATIVE   Ketones, ur NEGATIVE  NEGATIVE mg/dL   Specific Gravity, Urine 1.020  1.005 - 1.030   Hgb urine dipstick NEGATIVE  NEGATIVE   pH 6.0  5.0 - 8.0   Protein, ur NEGATIVE  NEGATIVE mg/dL   Urobilinogen, UA 0.2  0.0 - 1.0 mg/dL   Nitrite NEGATIVE  NEGATIVE   Leukocytes, UA NEGATIVE  NEGATIVE    Urine culture was obtained as well as DNA probes for gonorrhea, Chlamydia, and Trichomonas.  Course in Urgent Care Center:   Was given Rocephin 250 mg IM and azithromycin 1000 mg by mouth.  Assessment:  The primary encounter diagnosis was Dysuria. A diagnosis of Lumbar strain, subsequent encounter was also pertinent to this visit.  There is no evidence that the back pain is being caused by any kidney or urinary problem, particularly no evidence of kidney stones, UTI, or pyelonephritis. I think he just has a back  strain. It's only been 5 days. I think is just going to take more time to get better. I suggest he followup with an orthopedist, Dr. Victorino Dike within the next week.  Dysuria maybe urethritis. Therefore we'll go ahead and treat with Rocephin and azithromycin. DNA probe results are pending.  Finally, he has nausea, vomiting, and diarrhea, possibly a viral gastroenteritis. Doubt that this is related either to the back problem or  his urinary symptoms.  Plan:   1.  The following meds were prescribed:   Discharge Medication List as of 09/01/2012  8:22 PM    START taking these medications   Details  cyclobenzaprine (FLEXERIL) 5 MG tablet Take 1 tablet (5 mg total) by mouth 3 (three) times daily as needed for muscle spasms., Starting 09/01/2012, Until Discontinued, Normal    meloxicam (MOBIC) 15 MG tablet Take 1 tablet (15 mg total) by mouth daily., Starting 09/01/2012, Until Discontinued, Normal       Also given a prescription for Percocet 5/325, #20, 1-2 every 6 hours as needed.  2.  The patient was instructed in symptomatic care and handouts were given. 3.  The patient was told to return if becoming worse in any way, if no better in 2 weeks, and given some red flag symptoms including worsening pain, fever, or new neurological symptoms or any difficulty urinating, persistent vomiting, or persistent diarrhea that would indicate earlier return. 4.  The patient was encouraged to try to be as active as possible and given some exercises to do followed by moist heat. 5.  Follow up with Dr. Toni Arthurs within the next week.    Reuben Likes, MD 09/01/12 2137

## 2012-09-03 LAB — URINE CULTURE
Colony Count: NO GROWTH
Culture: NO GROWTH

## 2012-09-07 ENCOUNTER — Encounter (HOSPITAL_COMMUNITY): Payer: Self-pay | Admitting: *Deleted

## 2012-09-07 ENCOUNTER — Emergency Department (HOSPITAL_COMMUNITY)
Admission: EM | Admit: 2012-09-07 | Discharge: 2012-09-08 | Disposition: A | Discharge status: Home / Self Care | Payer: Self-pay | Attending: Emergency Medicine | Admitting: Emergency Medicine

## 2012-09-07 DIAGNOSIS — Z79899 Other long term (current) drug therapy: Secondary | ICD-10-CM | POA: Insufficient documentation

## 2012-09-07 DIAGNOSIS — F17200 Nicotine dependence, unspecified, uncomplicated: Secondary | ICD-10-CM | POA: Insufficient documentation

## 2012-09-07 DIAGNOSIS — S9030XA Contusion of unspecified foot, initial encounter: Secondary | ICD-10-CM | POA: Insufficient documentation

## 2012-09-07 DIAGNOSIS — IMO0002 Reserved for concepts with insufficient information to code with codable children: Secondary | ICD-10-CM | POA: Insufficient documentation

## 2012-09-07 DIAGNOSIS — Z8669 Personal history of other diseases of the nervous system and sense organs: Secondary | ICD-10-CM | POA: Insufficient documentation

## 2012-09-07 DIAGNOSIS — S9031XA Contusion of right foot, initial encounter: Secondary | ICD-10-CM

## 2012-09-07 DIAGNOSIS — T07XXXA Unspecified multiple injuries, initial encounter: Secondary | ICD-10-CM

## 2012-09-07 DIAGNOSIS — Y9289 Other specified places as the place of occurrence of the external cause: Secondary | ICD-10-CM | POA: Insufficient documentation

## 2012-09-07 DIAGNOSIS — Y9389 Activity, other specified: Secondary | ICD-10-CM | POA: Insufficient documentation

## 2012-09-07 NOTE — ED Notes (Signed)
Pt states that he was opening his door of his car and slipped, his right leg brushed against the tire and caused abrassions down his shin, ankle and foot.

## 2012-09-08 ENCOUNTER — Emergency Department (HOSPITAL_COMMUNITY): Payer: Self-pay

## 2012-09-08 MED ORDER — HYDROCODONE-ACETAMINOPHEN 5-325 MG PO TABS: 2.0000 | ORAL_TABLET | ORAL | Status: DC | PRN

## 2012-09-08 MED ORDER — BACITRACIN-NEOMYCIN-POLYMYXIN OINTMENT TUBE
TOPICAL_OINTMENT | Freq: Once | CUTANEOUS | Status: AC
  Administered 2012-09-08: 01:00:00 via TOPICAL
  Filled 2012-09-08: qty 15
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
## 2012-09-08 NOTE — ED Provider Notes (Signed)
Medical screening examination/treatment/procedure(s) were performed by non-physician practitioner and as supervising physician I was immediately available for consultation/collaboration.   Hanley Seamen, MD 09/08/12 [phone]

## 2012-09-09 ENCOUNTER — Emergency Department (HOSPITAL_COMMUNITY): Payer: Self-pay

## 2012-09-09 ENCOUNTER — Encounter (HOSPITAL_COMMUNITY): Payer: Self-pay | Admitting: Emergency Medicine

## 2012-09-09 ENCOUNTER — Emergency Department (HOSPITAL_COMMUNITY)
Admission: EM | Admit: 2012-09-09 | Discharge: 2012-09-09 | Disposition: A | Discharge status: Home / Self Care | Payer: Self-pay | Attending: Emergency Medicine | Admitting: Emergency Medicine

## 2012-09-09 DIAGNOSIS — IMO0002 Reserved for concepts with insufficient information to code with codable children: Secondary | ICD-10-CM | POA: Insufficient documentation

## 2012-09-09 DIAGNOSIS — S93699A Other sprain of unspecified foot, initial encounter: Secondary | ICD-10-CM | POA: Insufficient documentation

## 2012-09-09 DIAGNOSIS — Y9389 Activity, other specified: Secondary | ICD-10-CM | POA: Insufficient documentation

## 2012-09-09 DIAGNOSIS — S93601A Unspecified sprain of right foot, initial encounter: Secondary | ICD-10-CM

## 2012-09-09 DIAGNOSIS — F17200 Nicotine dependence, unspecified, uncomplicated: Secondary | ICD-10-CM | POA: Insufficient documentation

## 2012-09-09 DIAGNOSIS — Y9289 Other specified places as the place of occurrence of the external cause: Secondary | ICD-10-CM | POA: Insufficient documentation

## 2012-09-09 DIAGNOSIS — S90811D Abrasion, right foot, subsequent encounter: Secondary | ICD-10-CM

## 2012-09-09 DIAGNOSIS — W108XXA Fall (on) (from) other stairs and steps, initial encounter: Secondary | ICD-10-CM | POA: Insufficient documentation

## 2012-09-09 NOTE — Progress Notes (Signed)
[facility] provided patient with a [facility] Orange Card application and a list of primary care resources.  °
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
## 2012-09-10 NOTE — ED Provider Notes (Signed)
Medical screening examination/treatment/procedure(s) were performed by non-physician practitioner and as supervising physician I was immediately available for consultation/collaboration.  Shawnia Vizcarrondo R. Jenin Birdsall, MD 09/10/12 0650 

## 2012-09-30 ENCOUNTER — Encounter (HOSPITAL_COMMUNITY): Payer: Self-pay | Admitting: Emergency Medicine

## 2012-09-30 ENCOUNTER — Emergency Department (INDEPENDENT_AMBULATORY_CARE_PROVIDER_SITE_OTHER)
Admission: EM | Admit: 2012-09-30 | Discharge: 2012-09-30 | Disposition: A | Discharge status: Home / Self Care | Payer: Self-pay | Source: Home / Self Care

## 2012-09-30 DIAGNOSIS — M545 Low back pain: Secondary | ICD-10-CM

## 2012-09-30 MED ORDER — MELOXICAM 15 MG PO TABS: 15.0000 mg | ORAL_TABLET | Freq: Every day | ORAL | Status: DC | PRN

## 2012-09-30 MED ORDER — CYCLOBENZAPRINE HCL 10 MG PO TABS: 10.0000 mg | ORAL_TABLET | Freq: Every evening | ORAL | Status: DC | PRN

## 2012-09-30 NOTE — ED Provider Notes (Signed)
Tyler Ellison is a 27 y.o. male who presents to Urgent Care today for low back pain starting about 5 days ago worsening over the weekend. 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 He notes pain in his bilateral low back. The pain sometimes radiates to the right posterior thigh. He denies any radiation the level of the knees. He denies any weakness numbness bowel bladder dysfunction or difficulty walking. He's tried some ibuprofen which has helped. Additionally shaking that which has helped. He denies any injury.    PMH reviewed. Healthy otherwise History  Substance Use Topics  . Smoking status: Current Some Day Smoker    Types: Cigarettes  . Smokeless tobacco: Not on file  . Alcohol Use: Yes     Comment: occasionally   ROS as above Medications reviewed. No current facility-administered medications for this encounter.   Current Outpatient Prescriptions  Medication Sig Dispense Refill  . cyclobenzaprine (FLEXERIL) 10 MG tablet Take 1 tablet (10 mg total) by mouth at bedtime as needed for muscle spasms.  30 tablet  0  . meloxicam (MOBIC) 15 MG tablet Take 1 tablet (15 mg total) by mouth daily as needed for pain.  30 tablet  0    Exam:  BP 124/68  Pulse 74  Temp(Src) 98 F (36.7 C) (Oral)  Resp 16  SpO2 99% Gen: Well NAD HEENT: EOMI,  MMM Lungs: CTABL Nl WOB Heart: RRR no MRG Abd: NABS, NT, ND Exts: Non edematous BL  LE, warm and well perfused.  Back: Nontender to spinal midline. Tender to palpation bilateral paraspinals and the lumbar area. Tender to palpation bilateral SI joints. Negative straight leg raise test and Faber test.  Strength is intact bilateral lower extremities. Patient can stand on heels and toes and squat down. Sensation and capillary refill are intact distally. Normal gait.  No results found for this or any previous visit (from the past 24 hour(s)). No results found.  Assessment and Plan: 27 y.o. male with lumbago.  Symptomatic  management with meloxicam and Flexeril heating pad. Work note for 3 days Followup with Dr. Katrinka Blazing as needed. Discussed warning signs or symptoms. Please see discharge instructions. Patient expresses understanding.      Rodolph Bong, MD 09/30/12 1005

## 2012-09-30 NOTE — ED Notes (Signed)
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

## 2012-12-16 ENCOUNTER — Encounter (HOSPITAL_COMMUNITY): Payer: Self-pay | Admitting: Emergency Medicine

## 2012-12-16 ENCOUNTER — Emergency Department (HOSPITAL_COMMUNITY)
Admission: EM | Admit: 2012-12-16 | Discharge: 2012-12-16 | Disposition: A | Discharge status: Home / Self Care | Payer: Self-pay | Attending: Emergency Medicine | Admitting: Emergency Medicine

## 2012-12-16 DIAGNOSIS — Z8669 Personal history of other diseases of the nervous system and sense organs: Secondary | ICD-10-CM | POA: Insufficient documentation

## 2012-12-16 DIAGNOSIS — R1084 Generalized abdominal pain: Secondary | ICD-10-CM | POA: Insufficient documentation

## 2012-12-16 DIAGNOSIS — R112 Nausea with vomiting, unspecified: Secondary | ICD-10-CM | POA: Insufficient documentation

## 2012-12-16 DIAGNOSIS — R109 Unspecified abdominal pain: Secondary | ICD-10-CM

## 2012-12-16 DIAGNOSIS — F17200 Nicotine dependence, unspecified, uncomplicated: Secondary | ICD-10-CM | POA: Insufficient documentation

## 2012-12-16 LAB — COMPREHENSIVE METABOLIC PANEL
ALT: 14 U/L (ref 0–53)
Alkaline Phosphatase: 51 U/L (ref 39–117)
CO2: 27 mEq/L (ref 19–32)
Chloride: 103 mEq/L (ref 96–112)
GFR calc Af Amer: >90 mL/min (ref >90)
GFR calc non Af Amer: 89 mL/min — ABNORMAL LOW (ref >90)
Glucose, Bld: 115 mg/dL — ABNORMAL HIGH (ref 70–99)
Potassium: 3.7 mEq/L (ref 3.5–5.1)
Sodium: 139 mEq/L (ref 135–145)

## 2012-12-16 LAB — URINALYSIS, ROUTINE W REFLEX MICROSCOPIC
Bilirubin Urine: NEGATIVE
Hgb urine dipstick: NEGATIVE
Specific Gravity, Urine: 1.025 (ref 1.005–1.030)
Urobilinogen, UA: 0.2 mg/dL (ref 0.0–1.0)

## 2012-12-16 LAB — CBC WITH DIFFERENTIAL/PLATELET
Lymphocytes Relative: 20 % (ref 12–46)
Lymphs Abs: 1.6 10*3/uL (ref 0.7–4.0)
Neutrophils Relative %: 73 % (ref 43–77)
Platelets: 232 10*3/uL (ref 150–400)
RBC: 5.6 MIL/uL (ref 4.22–5.81)
WBC: 7.7 10*3/uL (ref 4.0–10.5)

## 2012-12-16 MED ORDER — PROMETHAZINE HCL 25 MG PO TABS: 25.0000 mg | ORAL_TABLET | Freq: Four times a day (QID) | ORAL | Status: DC | PRN

## 2012-12-16 NOTE — ED Provider Notes (Signed)
Medical screening examination/treatment/procedure(s) were performed by non-physician practitioner and as supervising physician I was immediately available for consultation/collaboration.  EKG Interpretation   None         Guliana Weyandt L Nickie Deren, MD 12/16/12 1644 

## 2012-12-16 NOTE — ED Notes (Signed)
Patient tolerating PO fluids 

## 2012-12-16 NOTE — ED Notes (Signed)
Patient given water to drink.  

## 2012-12-16 NOTE — ED Notes (Signed)
Patient states that he had a stomach virus with onset 0200 last night.  States that he had stomach cramps, nausea, vomiting, denies diarrhea. Denies blood in vomitus.

## 2012-12-16 NOTE — ED Notes (Signed)
Pt reports abdominal pain and vomiting x 1 since last night.

## 2012-12-16 NOTE — ED Provider Notes (Signed)
CSN: [id number]     Arrival date & time 12/16/12  1047 History   First MD Initiated Contact with Patient 12/16/12 1129     Chief Complaint  Patient presents with  . Abdominal Pain   (Consider location/radiation/quality/duration/timing/severity/associated sxs/prior Treatment) HPI Comments: Patient presents today with a chief complaint of nausea, vomiting, and abdominal pain.  He reports that last evening he began having generalized abdominal cramping, which was intermittent throughout the night.  He denies any abdominal pain at this time.  He states that he had one episode of vomiting last evening.  He denies nausea at this time.  He has not taken anything for symptoms prior to arrival.  Denies diarrhea.  Last BM was yesterday, which he reports was normal.  No blood in emesis or blood in stool.  Denies fever or chills.  He reports that many people at his work have similar symptoms.    The history is provided by the patient.    Past Medical History  Diagnosis Date  . Vertigo    History reviewed. No pertinent past surgical history. No family history on file. History  Substance Use Topics  . Smoking status: Current Some Day Smoker    Types: Cigarettes  . Smokeless tobacco: Not on file  . Alcohol Use: Yes     Comment: occasionally    Review of Systems  Gastrointestinal: Positive for nausea, vomiting and abdominal pain. Negative for diarrhea.  All other systems reviewed and are negative.    Allergies  Review of patient's allergies indicates no known allergies.  Home Medications  No current outpatient prescriptions on file. BP 142/89  Pulse 60  Temp(Src) 97.6 F (36.4 C) (Oral)  Resp 20  SpO2 98% Physical Exam  Nursing note and vitals reviewed. Constitutional: He appears well-developed and well-nourished.  HENT:  Head: Normocephalic and atraumatic.  Mouth/Throat: Oropharynx is clear and moist.  Neck: Normal range of motion. Neck supple.  Cardiovascular: Normal rate,  regular rhythm and normal heart sounds.   Pulmonary/Chest: Effort normal and breath sounds normal.  Abdominal: Soft. Bowel sounds are normal. He exhibits no distension and no mass. There is no tenderness. There is no rebound and no guarding.  Neurological: He is alert.  Skin: Skin is warm and dry.  Psychiatric: He has a normal mood and affect.    ED Course  Procedures (including critical care time) Labs Review Labs Reviewed  COMPREHENSIVE METABOLIC PANEL - Abnormal; Notable for the following:    Glucose, Bld 115 (*)    GFR calc non Af Amer 89 (*)    All other components within normal limits  CBC WITH DIFFERENTIAL  LIPASE, BLOOD  URINALYSIS, ROUTINE W REFLEX MICROSCOPIC   Imaging Review No results found.  EKG Interpretation   None       MDM  No diagnosis found. Patient presenting with a chief complaint of abdominal pain, nausea, and vomiting.  Symptoms began last evening.  No abdominal pain or nausea upon arrival in the ED.  Abdomen soft and nontender on exam.  Patient afebrile.  Labs unremarkable.  Patient tolerating PO liquids.  Patient stable for discharge.  Patient discharged home with Rx for Zofran.   Return precautions given.    Santiago Glad, PA-C 12/16/12 [phone]

## 2013-05-05 ENCOUNTER — Emergency Department (INDEPENDENT_AMBULATORY_CARE_PROVIDER_SITE_OTHER)
Admission: EM | Admit: 2013-05-05 | Discharge: 2013-05-05 | Disposition: A | Discharge status: Home / Self Care | Payer: BC Managed Care – PPO | Source: Home / Self Care | Attending: Family Medicine | Admitting: Family Medicine

## 2013-05-05 ENCOUNTER — Encounter (HOSPITAL_COMMUNITY): Payer: Self-pay | Admitting: Emergency Medicine

## 2013-05-05 DIAGNOSIS — R197 Diarrhea, unspecified: Secondary | ICD-10-CM

## 2013-05-05 MED ORDER — ONDANSETRON 4 MG PO TBDP
ORAL_TABLET | ORAL | Status: AC
  Filled 2013-05-05: qty 1

## 2013-05-05 MED ORDER — ONDANSETRON 4 MG PO TBDP: 4.0000 mg | ORAL_TABLET | Freq: Three times a day (TID) | ORAL | Status: DC | PRN

## 2013-05-05 MED ORDER — DIPHENOXYLATE-ATROPINE 2.5-0.025 MG PO TABS: 2.0000 | ORAL_TABLET | Freq: Four times a day (QID) | ORAL | Status: DC | PRN

## 2013-05-05 MED ORDER — ONDANSETRON 4 MG PO TBDP
4.0000 mg | ORAL_TABLET | Freq: Once | ORAL | Status: AC
  Administered 2013-05-05: 4 mg via ORAL

## 2013-05-05 NOTE — ED Notes (Signed)
Sipping gatorade, tolerating well

## 2013-05-05 NOTE — ED Provider Notes (Signed)
Medical screening examination/treatment/procedure(s) were performed by resident physician or non-physician practitioner and as supervising physician I was immediately available for consultation/collaboration.   Remy Voiles DOUGLAS MD.   Desiraye Rolfson D Phyliss Hulick, MD 05/05/13 1357 

## 2013-05-05 NOTE — ED Notes (Signed)
General body aches, headache, nausea, vomiting, diarrhea symptoms started Monday.  Reports vomiting x 1 today, diarrhea x 3 today.  Patient thinks this is related to some meat he prepared on Sunday.  Patient reports he cannot eat anything, when asked what he had tried reports eating bread and hotdog pieces.

## 2013-05-05 NOTE — ED Notes (Signed)
Patient requested boss be called.  Patient gave Channing MuttersRoy [phone] as contact person.  Patient stood next to this nurse while phone call made.  Instructed nurse ok to tell boss why he was at [facility].

## 2013-05-05 NOTE — ED Provider Notes (Signed)
CSN: [id number]     Arrival date & time 05/05/13  0847 History   First MD Initiated Contact with Patient 05/05/13 [phone]     Chief Complaint  Patient presents with  . Emesis  . Diarrhea   (Consider location/radiation/quality/duration/timing/severity/associated sxs/prior Treatment) Patient is a 28 y.o. male presenting with vomiting. The history is provided by the patient. No language interpreter was used.  Emesis Severity:  Moderate Timing:  Constant Progression:  Worsening Chronicity:  New Recent urination:  Normal Relieved by:  Nothing Worsened by:  Nothing tried Ineffective treatments:  None tried Associated symptoms: diarrhea   Risk factors: no sick contacts   Pt reports he has had vomitting and diarrhea since yesterday.  No fever,  Pt think he ate some meat that he had let sit out to long   Past Medical History  Diagnosis Date  . Vertigo    History reviewed. No pertinent past surgical history. No family history on file. History  Substance Use Topics  . Smoking status: Current Some Day Smoker    Types: Cigarettes  . Smokeless tobacco: Not on file  . Alcohol Use: Yes     Comment: occasionally    Review of Systems  Gastrointestinal: Positive for vomiting and diarrhea.  All other systems reviewed and are negative.    Allergies  Review of patient's allergies indicates no known allergies.  Home Medications   Current Outpatient Rx  Name  Route  Sig  Dispense  Refill  . ibuprofen (ADVIL,MOTRIN) 200 MG tablet   Oral   Take 200 mg by mouth every 6 (six) hours as needed.         Marland Kitchen. EXPIRED: promethazine (PHENERGAN) 25 MG tablet   Oral   Take 1 tablet (25 mg total) by mouth every 6 (six) hours as needed for nausea.   12 tablet   0   . promethazine (PHENERGAN) 25 MG tablet   Oral   Take 1 tablet (25 mg total) by mouth every 6 (six) hours as needed for nausea.   20 tablet   0    There were no vitals taken for this visit. Physical Exam  Nursing note and vitals  reviewed. Constitutional: He is oriented to person, place, and time. He appears well-developed and well-nourished.  HENT:  Head: Normocephalic.  Right Ear: External ear normal.  Left Ear: External ear normal.  Nose: Nose normal.  Mouth/Throat: Oropharynx is clear and moist.  Eyes: Conjunctivae and EOM are normal. Pupils are equal, round, and reactive to light.  Neck: Normal range of motion.  Cardiovascular: Normal rate.   Pulmonary/Chest: Effort normal and breath sounds normal.  Abdominal: Soft. He exhibits no distension.  Musculoskeletal: Normal range of motion.  Neurological: He is alert and oriented to person, place, and time.  Psychiatric: He has a normal mood and affect.    ED Course  Procedures (including critical care time) Labs Review Labs Reviewed - No data to display Imaging Review No results found.  Pt has had several episodes of diarrhea.   Pt is nauseated.  Pt given zofran.    Pt able to tolerate po fluids.   MDM   1. Diarrhea    zofran odt.  Lomotil.   Drink plent of fluids     Tyler Ellison, [place] JerseyPA-C 05/05/13 1019

## 2013-05-05 NOTE — Discharge Instructions (Signed)
Diarrhea Diarrhea is frequent loose and watery bowel movements. It can cause you to feel weak and dehydrated. Dehydration can cause you to become tired and thirsty, have a dry mouth, and have decreased urination that often is dark yellow. Diarrhea is a sign of another problem, most often an infection that will not last long. In most cases, diarrhea typically lasts 2 3 days. However, it can last longer if it is a sign of something more serious. It is important to treat your diarrhea as directed by your caregive to lessen or prevent future episodes of diarrhea. CAUSES  Some common causes include:  Gastrointestinal infections caused by viruses, bacteria, or parasites.  Food poisoning or food allergies.  Certain medicines, such as antibiotics, chemotherapy, and laxatives.  Artificial sweeteners and fructose.  Digestive disorders. HOME CARE INSTRUCTIONS  Ensure adequate fluid intake (hydration): have 1 cup (8 oz) of fluid for each diarrhea episode. Avoid fluids that contain simple sugars or sports drinks, fruit juices, whole milk products, and sodas. Your urine should be clear or pale yellow if you are drinking enough fluids. Hydrate with an oral rehydration solution that you can purchase at pharmacies, retail stores, and online. You can prepare an oral rehydration solution at home by mixing the following ingredients together:    tsp table salt.   tsp baking soda.   tsp salt substitute containing potassium chloride.  1  tablespoons sugar.  1 L (34 oz) of water.  Certain foods and beverages may increase the speed at which food moves through the gastrointestinal (GI) tract. These foods and beverages should be avoided and include:  Caffeinated and alcoholic beverages.  High-fiber foods, such as raw fruits and vegetables, nuts, seeds, and whole grain breads and cereals.  Foods and beverages sweetened with sugar alcohols, such as xylitol, sorbitol, and mannitol.  Some foods may be well  tolerated and may help thicken stool including:  Starchy foods, such as rice, toast, pasta, low-sugar cereal, oatmeal, grits, baked potatoes, crackers, and bagels.  Bananas.  Applesauce.  Add probiotic-rich foods to help increase healthy bacteria in the GI tract, such as yogurt and fermented milk products.  Wash your hands well after each diarrhea episode.  Only take over-the-counter or prescription medicines as directed by your caregiver.  Take a warm bath to relieve any burning or pain from frequent diarrhea episodes. SEEK IMMEDIATE MEDICAL CARE IF:   You are unable to keep fluids down.  You have persistent vomiting.  You have blood in your stool, or your stools are black and tarry.  You do not urinate in 6 8 hours, or there is only a small amount of very dark urine.  You have abdominal pain that increases or localizes.  You have weakness, dizziness, confusion, or lightheadedness.  You have a severe headache.  Your diarrhea gets worse or does not get better.  You have a fever or persistent symptoms for more than 2 3 days.  You have a fever and your symptoms suddenly get worse. MAKE SURE YOU:   Understand these instructions.  Will watch your condition.  Will get help right away if you are not doing well or get worse. Document Released: 01/05/2002 Document Revised: 01/02/2012 Document Reviewed: 09/23/2011 [employer] Patient Information 2014 [employer].  

## 2013-06-30 ENCOUNTER — Encounter (HOSPITAL_COMMUNITY): Payer: Self-pay | Admitting: Emergency Medicine

## 2013-06-30 ENCOUNTER — Emergency Department (HOSPITAL_COMMUNITY)
Admission: EM | Admit: 2013-06-30 | Discharge: 2013-06-30 | Disposition: A | Discharge status: Home / Self Care | Payer: BC Managed Care – PPO | Attending: Emergency Medicine | Admitting: Emergency Medicine

## 2013-06-30 DIAGNOSIS — Z0000 Encounter for general adult medical examination without abnormal findings: Secondary | ICD-10-CM

## 2013-06-30 DIAGNOSIS — R112 Nausea with vomiting, unspecified: Secondary | ICD-10-CM | POA: Insufficient documentation

## 2013-06-30 DIAGNOSIS — F17200 Nicotine dependence, unspecified, uncomplicated: Secondary | ICD-10-CM | POA: Insufficient documentation

## 2013-06-30 NOTE — ED Notes (Signed)
Per pt sts he had some N,V from 5 am to 7 am today. sts he is feeling better not. Sts he has drank 2 Gatorades and eaten some crackers and feels fine. Denies any pain. sts that yesterday he spent all day at the pool and didn't eat much and drank some alcohol.

## 2013-06-30 NOTE — ED Provider Notes (Signed)
CSN: [id number]     Arrival date & time 06/30/13  1731 History  This chart was scribed for non-physician practitioner, Renne Crigler, PA-C, working with Gilda Crease, md by Shari Heritage, ED Scribe. This patient was seen in room TR08C/TR08C and the patient's care was started at 6:08 PM.   Chief Complaint  Patient presents with  . Nausea    The history is provided by the patient. No language interpreter was used.    HPI Comments: Tyler Ellison is a 28 y.o. male who presents to the Emergency Department complaining of an episode of constant nausea that began at 5 AM this morning and resolved by 7 AM. He reports 3 episodes of associated vomiting earlier today, but no episodes in the last several hours. Patient states that while he was having symptoms, he drank 2 Gatorades and ate some crackers and started to feel better. He did not take any medicines for symptom relief. Patient further explains that he spent the day entire day at the pool yesterday and he did not drink much water or eat very much. He was also drinking alcohol yesterday and he feels that his symptoms are likely due to hydration. He says that he came to the ED for evaluation because his employer wants him to be medically cleared. He denies abdominal pain, fever or chills.    Past Medical History  Diagnosis Date  . Vertigo    History reviewed. No pertinent past surgical history. History reviewed. No pertinent family history. History  Substance Use Topics  . Smoking status: Current Some Day Smoker    Types: Cigarettes  . Smokeless tobacco: Not on file  . Alcohol Use: Yes     Comment: occasionally    Review of Systems  Constitutional: Negative for fever and chills.  Respiratory: Negative for shortness of breath.   Cardiovascular: Negative for chest pain.  Gastrointestinal: Positive for nausea and vomiting. Negative for abdominal pain and diarrhea.    Allergies  Review of patient's allergies indicates no known  allergies.  Home Medications   Prior to Admission medications   Medication Sig Start Date End Date Taking? Authorizing Provider  diphenoxylate-atropine (LOMOTIL) 2.5-0.025 MG per tablet Take 2 tablets by mouth 4 (four) times daily as needed for diarrhea or loose stools. 05/05/13   Elson Areas, PA-C  ibuprofen (ADVIL,MOTRIN) 200 MG tablet Take 200 mg by mouth every 6 (six) hours as needed.    Historical Provider, MD  ondansetron (ZOFRAN ODT) 4 MG disintegrating tablet Take 1 tablet (4 mg total) by mouth every 8 (eight) hours as needed for nausea or vomiting. 05/05/13   Elson Areas, PA-C  promethazine (PHENERGAN) 25 MG tablet Take 1 tablet (25 mg total) by mouth every 6 (six) hours as needed for nausea. 03/04/11 03/11/11  Vanetta Mulders, MD  promethazine (PHENERGAN) 25 MG tablet Take 1 tablet (25 mg total) by mouth every 6 (six) hours as needed for nausea. 12/16/12   Santiago Glad, PA-C    Physical Exam  Nursing note and vitals reviewed. Constitutional: He appears well-developed and well-nourished. No distress.  HENT:  Head: Normocephalic and atraumatic.  Eyes: Conjunctivae and EOM are normal.  Neck: Normal range of motion. No tracheal deviation present.  Cardiovascular: Normal rate, regular rhythm and normal heart sounds.  Exam reveals no gallop and no friction rub.   No murmur heard. Pulmonary/Chest: Effort normal and breath sounds normal. No respiratory distress. He has no wheezes. He has no rales.  Abdominal: Soft. There is  no tenderness. There is no rebound and no guarding.  Musculoskeletal: Normal range of motion.  Neurological: He is alert.  Skin: Skin is warm and dry.  Psychiatric: He has a normal mood and affect. His behavior is normal.    ED Course  Procedures (including critical care time) DIAGNOSTIC STUDIES: Oxygen Saturation is 99% on room air, normal by my interpretation.    COORDINATION OF CARE: 6:11 PM- Patient informed of current plan for treatment and evaluation  and agrees with plan at this time.   BP 117/83  Pulse 89  Temp(Src) 98.9 F (37.2 C) (Oral)  Resp 18  Wt 189 lb 1 oz (85.758 kg)  SpO2 99%    MDM   Final diagnoses:  Nausea and vomiting in adult  Normal physical examination   Patient with nausea and vomiting this morning, needs note to return to work. Patient does not have any pain and is currently tolerating oral fluids without difficulty. Abdomen is soft and nontender. I do not suspect any acute process. This is likely some mild gastritis 2/2 drinking alcohol yesterday. Patient to return with any worsening symptoms.  I personally performed the services described in this documentation, which was scribed in my presence. The recorded information has been reviewed and is accurate.    Renne CriglerJoshua Kenn Rekowski, PA-C 06/30/13 1818

## 2013-06-30 NOTE — ED Provider Notes (Signed)
Medical screening examination/treatment/procedure(s) were performed by non-physician practitioner and as supervising physician I was immediately available for consultation/collaboration.  Christopher J. Pollina, MD 06/30/13 2059 

## 2013-06-30 NOTE — Discharge Instructions (Signed)
Please read and follow all provided instructions.  Your diagnoses today include:  1. Nausea and vomiting in adult   2. Normal physical examination     Tests performed today include:  Vital signs. See below for your results today.   Medications prescribed:   None  Take any prescribed medications only as directed.  Home care instructions:   Follow any educational materials contained in this packet.  Follow-up instructions: Please follow-up with your primary care provider in the next 3 days for further evaluation of your symptoms as needed. If you do not have a primary care doctor -- see below for referral information.   Return instructions:  SEEK IMMEDIATE MEDICAL ATTENTION IF:  The pain does not go away or becomes severe   A temperature above 101F develops   Repeated vomiting occurs (multiple episodes)   The pain becomes localized to portions of the abdomen. The right side could possibly be appendicitis. In an adult, the left lower portion of the abdomen could be colitis or diverticulitis.   Blood is being passed in stools or vomit (bright red or black tarry stools)   You develop chest pain, difficulty breathing, dizziness or fainting, or become confused, poorly responsive, or inconsolable (young children)  If you have any other emergent concerns regarding your health  Additional Information: Abdominal (belly) pain can be caused by many things. Your caregiver performed an examination and possibly ordered blood/urine tests and imaging (CT scan, x-rays, ultrasound). Many cases can be observed and treated at home after initial evaluation in the emergency department. Even though you  are being discharged home, abdominal pain can be unpredictable. Therefore, you need a repeated exam if your pain does not resolve, returns, or worsens. Most patients with abdominal pain don't have to be admitted to the hospital or have surgery, but serious problems like appendicitis and gallbladder  attacks can start out as nonspecific pain. Many abdominal conditions cannot be diagnosed in one visit, so follow-up evaluations are very important.  Your vital signs today were: BP 117/83   Pulse 89   Temp(Src) 98.9 F (37.2 C) (Oral)   Resp 18   Wt 189 lb 1 oz (85.758 kg)   SpO2 99% If your blood pressure (bp) was elevated above 135/85 this visit, please have this repeated by your doctor within one month. --------------

## 2013-07-06 ENCOUNTER — Encounter (HOSPITAL_COMMUNITY): Payer: Self-pay | Admitting: Emergency Medicine

## 2013-07-06 ENCOUNTER — Emergency Department (HOSPITAL_COMMUNITY)
Admission: EM | Admit: 2013-07-06 | Discharge: 2013-07-06 | Disposition: A | Discharge status: Home / Self Care | Payer: BC Managed Care – PPO | Attending: Emergency Medicine | Admitting: Emergency Medicine

## 2013-07-06 DIAGNOSIS — R519 Headache, unspecified: Secondary | ICD-10-CM

## 2013-07-06 DIAGNOSIS — R112 Nausea with vomiting, unspecified: Secondary | ICD-10-CM

## 2013-07-06 DIAGNOSIS — H53149 Visual discomfort, unspecified: Secondary | ICD-10-CM | POA: Insufficient documentation

## 2013-07-06 DIAGNOSIS — R51 Headache: Secondary | ICD-10-CM | POA: Insufficient documentation

## 2013-07-06 DIAGNOSIS — F17200 Nicotine dependence, unspecified, uncomplicated: Secondary | ICD-10-CM | POA: Insufficient documentation

## 2013-07-06 LAB — COMPREHENSIVE METABOLIC PANEL
ALBUMIN: 3.7 g/dL (ref 3.5–5.2)
ALK PHOS: 52 U/L (ref 39–117)
ALT: 13 U/L (ref 0–53)
AST: 18 U/L (ref 0–37)
BUN: 14 mg/dL (ref 6–23)
CHLORIDE: 106 meq/L (ref 96–112)
CO2: 26 meq/L (ref 19–32)
Calcium: 8.9 mg/dL (ref 8.4–10.5)
Creatinine, Ser: 1.03 mg/dL (ref 0.50–1.35)
GFR calc Af Amer: >90 mL/min (ref >90)
Glucose, Bld: 107 mg/dL — ABNORMAL HIGH (ref 70–99)
POTASSIUM: 4.1 meq/L (ref 3.7–5.3)
Sodium: 142 mEq/L (ref 137–147)
Total Protein: 6.5 g/dL (ref 6.0–8.3)

## 2013-07-06 LAB — CBC WITH DIFFERENTIAL/PLATELET
BASOS ABS: 0.1 10*3/uL (ref 0.0–0.1)
BASOS PCT: 1 % (ref 0–1)
Eosinophils Absolute: 0.1 10*3/uL (ref 0.0–0.7)
Eosinophils Relative: 2 % (ref 0–5)
HEMATOCRIT: 43 % (ref 39.0–52.0)
HEMOGLOBIN: 14.1 g/dL (ref 13.0–17.0)
LYMPHS PCT: 34 % (ref 12–46)
Lymphs Abs: 2.6 10*3/uL (ref 0.7–4.0)
MCH: 26.5 pg (ref 26.0–34.0)
MCHC: 32.8 g/dL (ref 30.0–36.0)
MCV: 80.8 fL (ref 78.0–100.0)
Monocytes Absolute: 0.5 10*3/uL (ref 0.1–1.0)
Monocytes Relative: 7 % (ref 3–12)
NEUTROS ABS: 4.3 10*3/uL (ref 1.7–7.7)
NEUTROS PCT: 56 % (ref 43–77)
Platelets: 203 10*3/uL (ref 150–400)
RBC: 5.32 MIL/uL (ref 4.22–5.81)
RDW: 13.1 % (ref 11.5–15.5)
WBC: 7.5 10*3/uL (ref 4.0–10.5)

## 2013-07-06 MED ORDER — SODIUM CHLORIDE 0.9 % IV SOLN
1000.0000 mL | INTRAVENOUS | Status: DC
  Administered 2013-07-06: 1000 mL via INTRAVENOUS

## 2013-07-06 MED ORDER — METOCLOPRAMIDE HCL 10 MG PO TABS: 10.0000 mg | ORAL_TABLET | Freq: Four times a day (QID) | ORAL | Status: DC | PRN

## 2013-07-06 MED ORDER — DIPHENHYDRAMINE HCL 50 MG/ML IJ SOLN
25.0000 mg | Freq: Once | INTRAMUSCULAR | Status: AC
  Administered 2013-07-06: 25 mg via INTRAVENOUS
  Filled 2013-07-06: qty 1

## 2013-07-06 MED ORDER — SODIUM CHLORIDE 0.9 % IV SOLN
1000.0000 mL | Freq: Once | INTRAVENOUS | Status: AC
  Administered 2013-07-06: 1000 mL via INTRAVENOUS

## 2013-07-06 MED ORDER — METOCLOPRAMIDE HCL 5 MG/ML IJ SOLN
10.0000 mg | Freq: Once | INTRAMUSCULAR | Status: AC
  Administered 2013-07-06: 10 mg via INTRAVENOUS
  Filled 2013-07-06: qty 2

## 2013-07-06 NOTE — ED Provider Notes (Signed)
CSN: [id number]     Arrival date & time 07/06/13  0418 History   First MD Initiated Contact with Patient 07/06/13 [phone]     Chief Complaint  Patient presents with  . Headache  . Emesis     (Consider location/radiation/quality/duration/timing/severity/associated sxs/prior Treatment) Patient is a 28 y.o. male presenting with headaches and vomiting. The history is provided by the patient.  Headache Associated symptoms: vomiting   Emesis Associated symptoms: headaches   He had onset about 36 hours ago of a moderate, diffuse headache. Headache is dull and described as a pressure feeling and he rates the pain at 5/10. Nothing makes it better nothing makes it worse. There is no associated photophobia or phonophobia. Last evening, he started developing nausea and vomiting. He denies constipation or diarrhea. He denies fever or chills. He denies abdominal pain. He has had sick contacts so with people who have had nausea and vomiting. He denies any rash. He had taken ibuprofen which did not seem to give him much relief.  Past Medical History  Diagnosis Date  . Vertigo    History reviewed. No pertinent past surgical history. No family history on file. History  Substance Use Topics  . Smoking status: Current Some Day Smoker    Types: Cigarettes  . Smokeless tobacco: Not on file  . Alcohol Use: Yes     Comment: occasionally    Review of Systems  Gastrointestinal: Positive for vomiting.  Neurological: Positive for headaches.  All other systems reviewed and are negative.     Allergies  Review of patient's allergies indicates no known allergies.  Home Medications   Prior to Admission medications   Medication Sig Start Date End Date Taking? Authorizing Provider  ibuprofen (ADVIL,MOTRIN) 200 MG tablet Take 40 mg by mouth every 6 (six) hours as needed for headache.   Yes Historical Provider, MD   BP 114/79  Pulse 54  Temp(Src) 97.7 F (36.5 C) (Oral)  Resp 19  Ht 6\' 4"  (1.93 m)  Wt  190 lb (86.183 kg)  BMI 23.14 kg/m2  SpO2 98% Physical Exam  Nursing note and vitals reviewed.  28 year old male, resting comfortably and in no acute distress. Vital signs are significant for bradycardia with heart rate 54. Oxygen saturation is 98%, which is normal. Head is normocephalic and atraumatic. PERRLA, EOMI. Oropharynx is clear. Fundi show no hemorrhage, exudate, or papilledema. Neck is nontender and supple without adenopathy or JVD. Back is nontender and there is no CVA tenderness. Lungs are clear without rales, wheezes, or rhonchi. Chest is nontender. Heart has regular rate and rhythm without murmur. Abdomen is soft, flat, nontender without masses or hepatosplenomegaly and peristalsis is hypoactive. Extremities have no cyanosis or edema, full range of motion is present. Skin is warm and dry without rash. Neurologic: Mental status is normal, cranial nerves are intact, there are no motor or sensory deficits.  ED Course  Procedures (including critical care time) Labs Review Results for orders placed during the hospital encounter of 07/06/13  CBC WITH DIFFERENTIAL      Result Value Ref Range   WBC 7.5  4.0 - 10.5 K/uL   RBC 5.32  4.22 - 5.81 MIL/uL   Hemoglobin 14.1  13.0 - 17.0 g/dL   HCT 11.9  14.7 - 82.9 %   MCV 80.8  78.0 - 100.0 fL   MCH 26.5  26.0 - 34.0 pg   MCHC 32.8  30.0 - 36.0 g/dL   RDW 56.2  13.0 - 86.5 %  Platelets 203  150 - 400 K/uL   Neutrophils Relative % 56  43 - 77 %   Neutro Abs 4.3  1.7 - 7.7 K/uL   Lymphocytes Relative 34  12 - 46 %   Lymphs Abs 2.6  0.7 - 4.0 K/uL   Monocytes Relative 7  3 - 12 %   Monocytes Absolute 0.5  0.1 - 1.0 K/uL   Eosinophils Relative 2  0 - 5 %   Eosinophils Absolute 0.1  0.0 - 0.7 K/uL   Basophils Relative 1  0 - 1 %   Basophils Absolute 0.1  0.0 - 0.1 K/uL  COMPREHENSIVE METABOLIC PANEL      Result Value Ref Range   Sodium 142  137 - 147 mEq/L   Potassium 4.1  3.7 - 5.3 mEq/L   Chloride 106  96 - 112 mEq/L    CO2 26  19 - 32 mEq/L   Glucose, Bld 107 (*) 70 - 99 mg/dL   BUN 14  6 - 23 mg/dL   Creatinine, Ser 4.091.03  0.50 - 1.35 mg/dL   Calcium 8.9  8.4 - 81.110.5 mg/dL   Total Protein 6.5  6.0 - 8.3 g/dL   Albumin 3.7  3.5 - 5.2 g/dL   AST 18  0 - 37 U/L   ALT 13  0 - 53 U/L   Alkaline Phosphatase 52  39 - 117 U/L   Total Bilirubin <0.2 (*) 0.3 - 1.2 mg/dL   GFR calc non Af Amer >90  >90 mL/min   GFR calc Af Amer >90  >90 mL/min    MDM   Final diagnoses:  Headache  Nausea & vomiting    Headache and vomiting. They may indeed be related. Presence of sick contacts could indicate a viral illness and headache could be part of that syndrome although it is unusual for the headache to proceed the vomiting by that length of time. Old records are reviewed and he has several other ED visits recently for nausea and vomiting. Because of that, screening labs will be obtained. He was given IV fluids, IV metoclopramide, and IV diphenhydramine.  He feels much better after above noted treatment. Laboratory workup is unremarkable except for borderline elevated glucose. He is discharged with prescriptions for metoclopramide.  Dione Boozeavid Ladarrian Asencio, MD 07/06/13 [phone]

## 2013-07-06 NOTE — ED Notes (Signed)
Discharge instructions reviewed. Pt verbalized understanding.  

## 2013-07-06 NOTE — ED Notes (Signed)
Pt. reports headache onset last Saturday and emesis yesterday , denies diarrhea or fever .

## 2013-07-06 NOTE — ED Notes (Signed)
Pt c/o of headache that started after being at the pool for a few hours on Saturday. Pt states he also began vomiting yesterday. Pt had 3 episodes. Pt reports ibuprofen helped some for his headache but he still has some pain. Pt rates pain 5/10. Denies any blurred vision, Lightheadedness or dizziness. Pt states his niece has been sick and he has been around her, she has had vomiting.

## 2013-07-16 ENCOUNTER — Emergency Department (HOSPITAL_COMMUNITY)
Admission: EM | Admit: 2013-07-16 | Discharge: 2013-07-16 | Disposition: A | Discharge status: Home / Self Care | Payer: BC Managed Care – PPO | Attending: Emergency Medicine | Admitting: Emergency Medicine

## 2013-07-16 ENCOUNTER — Encounter (HOSPITAL_COMMUNITY): Payer: Self-pay | Admitting: Emergency Medicine

## 2013-07-16 DIAGNOSIS — F17200 Nicotine dependence, unspecified, uncomplicated: Secondary | ICD-10-CM | POA: Insufficient documentation

## 2013-07-16 DIAGNOSIS — H109 Unspecified conjunctivitis: Secondary | ICD-10-CM

## 2013-07-16 MED ORDER — FLUORESCEIN SODIUM 1 MG OP STRP
2.0000 | ORAL_STRIP | Freq: Once | OPHTHALMIC | Status: AC
  Administered 2013-07-16: 2 via OPHTHALMIC
  Filled 2013-07-16: qty 2

## 2013-07-16 MED ORDER — TETRACAINE HCL 0.5 % OP SOLN
1.0000 [drp] | Freq: Once | OPHTHALMIC | Status: AC
  Administered 2013-07-16: 1 [drp] via OPHTHALMIC
  Filled 2013-07-16: qty 2

## 2013-07-16 MED ORDER — POLYMYXIN B-TRIMETHOPRIM 10000-0.1 UNIT/ML-% OP SOLN
1.0000 [drp] | OPHTHALMIC | Status: DC
  Administered 2013-07-16: 1 [drp] via OPHTHALMIC
  Filled 2013-07-16 (×2): qty 10

## 2013-07-16 NOTE — ED Provider Notes (Signed)
CSN: [id number]     Arrival date & time 07/16/13  1942 History  This chart was scribed for Tyler Horsemanobert Browning, PA, working with Flint MelterElliott L Wentz, MD, by Bronson CurbJacqueline Melvin, ED Scribe. This patient was seen in room TR05C/TR05C and the patient's care was started at 8:30 PM.      Chief Complaint  Patient presents with  . Eye Drainage     The history is provided by the patient. No language interpreter was used.    HPI Comments: Gerrianne Scalearon Gurney is a 28 y.o. male who presents to the Emergency Department with a chief complaint of bilateral eye drainage that began yesterday. 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 There is associated drainage, irritation, and redness. Patient reports he has gotten dust in his eyes in the past and rinsed his eyes with relief. However, he reports this has been ineffective, and is concerned of his persistent symptoms. He denies itching or photophobia.  Past Medical History  Diagnosis Date  . Vertigo    History reviewed. No pertinent past surgical history. No family history on file. History  Substance Use Topics  . Smoking status: Current Some Day Smoker    Types: Cigarettes  . Smokeless tobacco: Not on file  . Alcohol Use: Yes     Comment: occasionally    Review of Systems  Constitutional: Negative for fever and chills.  Eyes: Positive for discharge and redness. Negative for photophobia and itching.      Allergies  Review of patient's allergies indicates no known allergies.  Home Medications   Prior to Admission medications   Medication Sig Start Date End Date Taking? Authorizing Provider  ibuprofen (ADVIL,MOTRIN) 200 MG tablet Take 40 mg by mouth every 6 (six) hours as needed for headache.    Historical Provider, MD  metoCLOPramide (REGLAN) 10 MG tablet Take 1 tablet (10 mg total) by mouth every 6 (six) hours as needed for nausea (or headache). 07/06/13   Dione Boozeavid Glick, MD   Triage Vitals:BP 157/92  Pulse 101  Temp(Src)  98.3 F (36.8 C) (Oral)  Resp 19  Wt 190 lb 14.4 oz (86.592 kg)  SpO2 96%  Physical Exam  Nursing note and vitals reviewed. Constitutional: He is oriented to person, place, and time. He appears well-developed and well-nourished. No distress.  HENT:  Head: Normocephalic and atraumatic.  Eyes: EOM are normal. Right eye exhibits discharge. Left eye exhibits discharge. Right conjunctiva is injected. Left conjunctiva is injected.  Bilateral conjunctiva are injected with mild discharge. No consensual photophobia, no fluorescein uptake, no foreign body.   Bilateral Near   25      Bilateral Distance   15      R Near   30      R Distance   30      L Near   30      L Distance   30    Neck: Neck supple. No tracheal deviation present.  Cardiovascular: Normal rate.   Pulmonary/Chest: Effort normal. No respiratory distress.  Musculoskeletal: Normal range of motion.  Neurological: He is alert and oriented to person, place, and time.  Skin: Skin is warm and dry.  Psychiatric: He has a normal mood and affect. His behavior is normal.    ED Course  Procedures (including critical care time)  DIAGNOSTIC STUDIES: Oxygen Saturation is 96% on room air, adequate by my interpretation.    COORDINATION OF CARE: At 2034 Discussed treatment plan with  patient which includes visual acuity screening. Patient agrees.   Labs Review Labs Reviewed - No data to display  Imaging Review No results found.   EKG Interpretation None      MDM   Final diagnoses:  Bilateral conjunctivitis    Patient with conjunctivitis.  Mild discharge.  Bilateral.  Treat with polytrim. No foreign body.  No abrasion.  I personally performed the services described in this documentation, which was scribed in my presence. The recorded information has been reviewed and is accurate.    Tyler Horsemanobert Browning, PA-C 07/16/13 [id number]

## 2013-07-16 NOTE — ED Notes (Signed)
The pt has itching red irritated rt and lt eyes since yesterday.  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

## 2013-07-16 NOTE — Discharge Instructions (Signed)

## 2013-07-16 NOTE — ED Notes (Signed)
Declined W/C at D/C and was escorted to lobby by RN. 

## 2013-07-17 NOTE — ED Provider Notes (Signed)
Medical screening examination/treatment/procedure(s) were performed by non-physician practitioner and as supervising physician I was immediately available for consultation/collaboration.   EKG Interpretation None       Flint MelterElliott L Wentz, MD 07/17/13 1718

## 2014-04-27 ENCOUNTER — Emergency Department (INDEPENDENT_AMBULATORY_CARE_PROVIDER_SITE_OTHER)
Admission: EM | Admit: 2014-04-27 | Discharge: 2014-04-27 | Disposition: A | Discharge status: Home / Self Care | Payer: Self-pay | Source: Home / Self Care | Attending: Emergency Medicine | Admitting: Emergency Medicine

## 2014-04-27 ENCOUNTER — Encounter (HOSPITAL_COMMUNITY): Payer: Self-pay | Admitting: Emergency Medicine

## 2014-04-27 DIAGNOSIS — R1111 Vomiting without nausea: Secondary | ICD-10-CM

## 2014-04-27 NOTE — ED Notes (Signed)
Pt states that he had abdominal pain this morning and had 1 episode of emesis at 10am

## 2014-04-27 NOTE — ED Provider Notes (Signed)
CSN: [id number]     Arrival date & time 04/27/14  1735 History   First MD Initiated Contact with Patient 04/27/14 1846     Chief Complaint  Patient presents with  . Abdominal Pain   (Consider location/radiation/quality/duration/timing/severity/associated sxs/prior Treatment) HPI  He is a 29 year old man here for evaluation of abdominal pain. He states this morning at work he had an episode of vomiting that was associated with right side pain. His pain has since resolved. He denies any nausea or recurrent vomiting. He states he drank some milk that had expired yesterday, which he thinks may have contributed to the vomiting. No fevers.  Past Medical History  Diagnosis Date  . Vertigo    History reviewed. No pertinent past surgical history. History reviewed. No pertinent family history. History  Substance Use Topics  . Smoking status: Current Some Day Smoker    Types: Cigarettes  . Smokeless tobacco: Not on file  . Alcohol Use: Yes     Comment: occasionally    Review of Systems  Constitutional: Negative for fever.  Gastrointestinal: Positive for vomiting and abdominal pain. Negative for nausea.    Allergies  Review of patient's allergies indicates no known allergies.  Home Medications   Prior to Admission medications   Not on File   BP 127/77 mmHg  Pulse 60  Temp(Src) 97.6 F (36.4 C) (Oral)  Resp 12  SpO2 95% Physical Exam  Constitutional: He appears well-developed and well-nourished. No distress.  Cardiovascular: Normal rate, regular rhythm and normal heart sounds.   No murmur heard. Pulmonary/Chest: Effort normal and breath sounds normal. No respiratory distress. He has no wheezes. He has no rales.  Abdominal: Soft. Bowel sounds are normal. He exhibits no distension. There is no tenderness. There is no rebound and no guarding.  No CVA tenderness    ED Course  Procedures (including critical care time) Labs Review Labs Reviewed - No data to display  Imaging  Review No results found.   MDM   1. Non-intractable vomiting without nausea, vomiting of unspecified type    Suspect this was a mild case of food poisoning. His symptoms have since resolved. Work note provided.  He was tearful when I walked in the room. He denies any additional concerns or problems today. Follow-up as needed.    Charm RingsErin J Lavere Stork, MD 04/27/14 [phone]

## 2014-04-27 NOTE — Discharge Instructions (Signed)
I'm glad you are feeling better. Please come back if we can help you with anything else.
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
## 2014-07-19 ENCOUNTER — Encounter (HOSPITAL_COMMUNITY): Payer: Self-pay | Admitting: *Deleted

## 2014-07-19 ENCOUNTER — Emergency Department (HOSPITAL_COMMUNITY)
Admission: EM | Admit: 2014-07-19 | Discharge: 2014-07-19 | Disposition: A | Discharge status: Home / Self Care | Payer: Self-pay | Attending: Emergency Medicine | Admitting: Emergency Medicine

## 2014-07-19 DIAGNOSIS — H109 Unspecified conjunctivitis: Secondary | ICD-10-CM | POA: Insufficient documentation

## 2014-07-19 DIAGNOSIS — Z720 Tobacco use: Secondary | ICD-10-CM | POA: Insufficient documentation

## 2014-07-19 DIAGNOSIS — J029 Acute pharyngitis, unspecified: Secondary | ICD-10-CM | POA: Insufficient documentation

## 2014-07-19 DIAGNOSIS — R0981 Nasal congestion: Secondary | ICD-10-CM | POA: Insufficient documentation

## 2014-07-19 DIAGNOSIS — J3489 Other specified disorders of nose and nasal sinuses: Secondary | ICD-10-CM | POA: Insufficient documentation

## 2014-07-19 MED ORDER — TETRACAINE HCL 0.5 % OP SOLN
1.0000 [drp] | Freq: Once | OPHTHALMIC | Status: AC
  Administered 2014-07-19: 1 [drp] via OPHTHALMIC

## 2014-07-19 MED ORDER — TOBRAMYCIN 0.3 % OP SOLN: 1.0000 [drp] | OPHTHALMIC | Status: DC

## 2014-07-19 MED ORDER — FLUORESCEIN SODIUM 1 MG OP STRP
1.0000 | ORAL_STRIP | Freq: Once | OPHTHALMIC | Status: AC
  Administered 2014-07-19: 1 via OPHTHALMIC

## 2014-07-19 NOTE — ED Provider Notes (Signed)
CSN: [id number]     Arrival date & time 07/19/14  0152 History   First MD Initiated Contact with Patient 07/19/14 0214     Chief Complaint  Patient presents with  . Eye Drainage    (Consider location/radiation/quality/duration/timing/severity/associated sxs/prior Treatment) HPI Comments: 29 year old male with no significant past medical history presents to the emergency department for further evaluation of right eye redness. Patient states that his eye has been progressively red and irritated throughout the day today. Symptoms preceded by nasal congestion, runny nose, and sinus congestion with sore throat. No medications taken prior to arrival for symptoms. He has had some mild purulent discharge from his right eye. He denies any eye pain or itchiness. No fever. Patient denies associated sick contacts or history of seasonal allergies. He denies wearing glasses or contacts or any foreign bodies coming in contact with his eye.  The history is provided by the patient. No language interpreter was used.    Past Medical History  Diagnosis Date  . Vertigo    History reviewed. No pertinent past surgical history. No family history on file. History  Substance Use Topics  . Smoking status: Current Some Day Smoker    Types: Cigarettes  . Smokeless tobacco: Not on file  . Alcohol Use: Yes     Comment: occasionally    Review of Systems  Constitutional: Negative for fever.  HENT: Positive for congestion, rhinorrhea, sinus pressure and sore throat.   Eyes: Positive for discharge and redness. Negative for photophobia, pain and visual disturbance.  All other systems reviewed and are negative.   Allergies  Review of patient's allergies indicates no known allergies.  Home Medications   Prior to Admission medications   Medication Sig Start Date End Date Taking? Authorizing Provider  tobramycin (TOBREX) 0.3 % ophthalmic solution Place 1 drop into the right eye every 4 (four) hours. Place one  drop in the affected eye every 4 hours for 7 days 07/19/14   Antony Madura, PA-C   BP 124/68 mmHg  Pulse 63  Temp(Src) 97.4 F (36.3 C) (Oral)  Resp 16  Ht 6\' 4"  (1.93 m)  Wt 198 lb 3 oz (89.897 kg)  BMI 24.13 kg/m2  SpO2 97%   Physical Exam  Constitutional: He is oriented to person, place, and time. He appears well-developed and well-nourished. No distress.  HENT:  Head: Normocephalic and atraumatic.  Mouth/Throat: No oropharyngeal exudate.  Mild posterior oropharyngeal erythema  Eyes: EOM are normal. Pupils are equal, round, and reactive to light. Lids are everted and swept, no foreign bodies found. Right conjunctiva is injected (mild). Right conjunctiva has no hemorrhage. Left conjunctiva is not injected. Left conjunctiva has no hemorrhage. No scleral icterus.  Slit lamp exam:      The right eye shows no corneal abrasion, no corneal flare, no corneal ulcer, no fluorescein uptake and no anterior chamber bulge.  Snellen 20/20 OS, OD, OU. No proptosis or hyphema. Mild conjunctival injection noted. No blepharitis or orbital edema or erythema. No foreign body visualized on cornea. EOMs normal; no pain with EOMs. Negative Sidel's sign.  Neck: Normal range of motion.  Pulmonary/Chest: Effort normal. No respiratory distress.  Musculoskeletal: Normal range of motion.  Neurological: He is alert and oriented to person, place, and time.  Skin: Skin is warm and dry. No rash noted. He is not diaphoretic. No erythema. No pallor.  Psychiatric: He has a normal mood and affect. His behavior is normal.  Nursing note and vitals reviewed.   ED Course  Procedures (including critical care time) Labs Review Labs Reviewed - No data to display  Imaging Review No results found.   EKG Interpretation None      MDM   Final diagnoses:  Conjunctivitis of right eye    29 year old male presents to the emergency department with symptoms consistent with conjunctivitis. Symptoms associated with upper  respiratory symptoms and sinus congestion. Possible the symptoms are viral in nature, but will cover for bacterial etiology with Tobrex. No concern for globe rupture or subconjunctival hemorrhage. No proptosis or hyphema. Negative Sidel's sign on exam. No foreign bodies visualized. No corneal abrasion or ulcer. Visual acuity intact. Patient stable for discharge at this time. Return precautions discussed and provided. Patient agreeable to plan with no unaddressed concerns. Patient discharged in good condition.   Filed Vitals:   07/19/14 0200  BP: 124/68  Pulse: 63  Temp: 97.4 F (36.3 C)  TempSrc: Oral  Resp: 16  Height:  (1.93 m)  Weight: 198 lb 3 oz (89.897 kg)  SpO2: 97%     Antony Madura, PA-C 07/19/14 0344  Geoffery Lyons, MD 07/19/14 [id number]

## 2014-07-19 NOTE — Discharge Instructions (Signed)

## 2014-07-19 NOTE — ED Notes (Signed)
Rt eye red and irritated today he thinks he has the pink eye painful

## 2018-06-11 ENCOUNTER — Other Ambulatory Visit: Payer: Self-pay

## 2018-06-11 ENCOUNTER — Ambulatory Visit (HOSPITAL_COMMUNITY)
Admission: EM | Admit: 2018-06-11 | Discharge: 2018-06-11 | Disposition: A | Discharge status: Home / Self Care | Payer: 59 | Attending: Family Medicine | Admitting: Family Medicine

## 2018-06-11 ENCOUNTER — Encounter (HOSPITAL_COMMUNITY): Payer: Self-pay

## 2018-06-11 DIAGNOSIS — H6690 Otitis media, unspecified, unspecified ear: Secondary | ICD-10-CM | POA: Diagnosis not present

## 2018-06-11 MED ORDER — AMOXICILLIN 500 MG PO CAPS: 1000.0000 mg | ORAL_CAPSULE | Freq: Three times a day (TID) | ORAL | 0 refills | Status: AC

## 2018-06-11 NOTE — Discharge Instructions (Addendum)
We are treating you for ear infection Take the medication as prescribed.  You can take tylenol or ibuprofen for the pain Follow up as needed for continued or worsening symptoms

## 2018-06-11 NOTE — ED Triage Notes (Signed)
Pt cc left ear discomfort. Pt states its very painful. X 5 days. Pt states its pressure.

## 2018-06-11 NOTE — ED Provider Notes (Signed)
[facility]    CSN: [id number] Arrival date & time: 06/11/18  0932     History   Chief Complaint Chief Complaint  Patient presents with  . Otalgia    HPI Tyler Ellison is a 33 y.o. male.   Patient is a 33 year old male who presents today with left ear pain.  This is been constant and remain the same over the past 5 days.  Describes the pain as pressure and aching.  He is not taking any medication for his symptoms.  Denies any associated cough, congestion, rhinorrhea, fevers, chills or body aches.  Denies any dizziness or foreign body in the ear.  Denies any dental pain, facial pain or swelling.  ROS per HPI      Past Medical History:  Diagnosis Date  . Vertigo     There are no active problems to display for this patient.   No past surgical history on file.     Home Medications    Prior to Admission medications   Medication Sig Start Date End Date Taking? Authorizing Provider  amoxicillin (AMOXIL) 500 MG capsule Take 2 capsules (1,000 mg total) by mouth 3 (three) times daily for 5 days. 06/11/18 06/16/18  Dahlia ByesBast, Carlitos Bottino A, NP  tobramycin (TOBREX) 0.3 % ophthalmic solution Place 1 drop into the right eye every 4 (four) hours. Place one drop in the affected eye every 4 hours for 7 days 07/19/14   Antony MaduraHumes, Kelly, PA-C    Family History No family history on file.  Social History Social History   Tobacco Use  . Smoking status: Current Some Day Smoker    Types: Cigarettes  Substance Use Topics  . Alcohol use: Yes    Comment: occasionally  . Drug use: Yes    Types: Marijuana     Allergies   Patient has no known allergies.   Review of Systems Review of Systems   Physical Exam Triage Vital Signs ED Triage Vitals  Enc Vitals Group     BP 06/11/18 0956 116/76     Pulse Rate 06/11/18 0956 80     Resp 06/11/18 0956 18     Temp 06/11/18 0956 97.8 F (36.6 C)     Temp src --      SpO2 06/11/18 0956 99 %     Weight 06/11/18 0954 195 lb (88.5 kg)     Height --      Head Circumference --      Peak Flow --      Pain Score 06/11/18 0954 8     Pain Loc --      Pain Edu? --      Excl. in GC? --    No data found.  Updated Vital Signs BP 116/76 (BP Location: Right Arm)   Pulse 80   Temp 97.8 F (36.6 C)   Resp 18   Wt 195 lb (88.5 kg)   SpO2 99%   BMI 23.74 kg/m   Visual Acuity Right Eye Distance:   Left Eye Distance:   Bilateral Distance:    Right Eye Near:   Left Eye Near:    Bilateral Near:     Physical Exam Vitals signs and nursing note reviewed.  Constitutional:      General: He is not in acute distress.    Appearance: Normal appearance. He is not ill-appearing, toxic-appearing or diaphoretic.  HENT:     Head: Normocephalic and atraumatic.     Right Ear: Tympanic membrane and ear canal normal.  Left Ear: External ear normal. Tympanic membrane is erythematous and retracted.     Nose: Nose normal.  Eyes:     Conjunctiva/sclera: Conjunctivae normal.  Neck:     Musculoskeletal: Normal range of motion.  Pulmonary:     Effort: Pulmonary effort is normal.  Musculoskeletal: Normal range of motion.  Skin:    General: Skin is warm and dry.  Neurological:     Mental Status: He is alert.  Psychiatric:        Mood and Affect: Mood normal.      UC Treatments / Results  Labs (all labs ordered are listed, but only abnormal results are displayed) Labs Reviewed - No data to display  EKG None  Radiology No results found.  Procedures Procedures (including critical care time)  Medications Ordered in UC Medications - No data to display  Initial Impression / Assessment and Plan / UC Course  I have reviewed the triage vital signs and the nursing notes.  Pertinent labs & imaging results that were available during my care of the patient were reviewed by me and considered in my medical decision making (see chart for details).     Left otitis media  Symptoms consistent with left otitis media. We will  treat this with amoxicillin He can take Tylenol or Profen as needed for pain Follow up as needed for continued or worsening symptoms  Final Clinical Impressions(s) / UC Diagnoses   Final diagnoses:  Acute otitis media, unspecified otitis media type     Discharge Instructions     We are treating you for ear infection Take the medication as prescribed.  You can take tylenol or ibuprofen for the pain Follow up as needed for continued or worsening symptoms     ED Prescriptions    Medication Sig Dispense Auth. Provider   amoxicillin (AMOXIL) 500 MG capsule Take 2 capsules (1,000 mg total) by mouth 3 (three) times daily for 5 days. 30 capsule Dahlia Byes A, NP     Controlled Substance Prescriptions [place] Controlled Substance Registry consulted? Not Applicable   Janace Aris, NP 06/11/18 1021

## 2018-06-12 ENCOUNTER — Emergency Department (HOSPITAL_COMMUNITY)
Admission: EM | Admit: 2018-06-12 | Discharge: 2018-06-12 | Disposition: A | Discharge status: Home / Self Care | Payer: 59 | Attending: Emergency Medicine | Admitting: Emergency Medicine

## 2018-06-12 ENCOUNTER — Other Ambulatory Visit: Payer: Self-pay

## 2018-06-12 ENCOUNTER — Encounter (HOSPITAL_COMMUNITY): Payer: Self-pay | Admitting: Emergency Medicine

## 2018-06-12 DIAGNOSIS — R29810 Facial weakness: Secondary | ICD-10-CM | POA: Diagnosis present

## 2018-06-12 DIAGNOSIS — F17210 Nicotine dependence, cigarettes, uncomplicated: Secondary | ICD-10-CM | POA: Diagnosis not present

## 2018-06-12 DIAGNOSIS — G510 Bell's palsy: Secondary | ICD-10-CM

## 2018-06-12 MED ORDER — VALACYCLOVIR HCL 1 G PO TABS: 1000.0000 mg | ORAL_TABLET | Freq: Three times a day (TID) | ORAL | 0 refills | Status: DC

## 2018-06-12 MED ORDER — PREDNISONE 50 MG PO TABS: 50.0000 mg | ORAL_TABLET | Freq: Every day | ORAL | 0 refills | Status: DC

## 2018-06-12 MED ORDER — HYPROMELLOSE (GONIOSCOPIC) 2.5 % OP SOLN: 1.0000 [drp] | OPHTHALMIC | 12 refills | Status: DC | PRN

## 2018-06-12 NOTE — ED Notes (Signed)
Patient verbalizes understanding of discharge instructions. Opportunity for questioning and answers were provided. Armband removed by staff, pt discharged from ED.  

## 2018-06-12 NOTE — ED Provider Notes (Signed)
[facility] EMERGENCY DEPARTMENT Provider Note   CSN: [id number] Arrival date & time: 06/12/18  1403    History   Chief Complaint Chief Complaint  Patient presents with  . Otitis Media  . L side facial numbness/weakness    HPI Tyler Ellison is a 33 y.o. male with a hx of tobacco abuse & vertigo who presents to the ED with complaints of L sided facial numbness/weakness over the past 48 hours. Patient states that he developed L ear pain approximately 5 days prior that progressively worsened, described as an ache. He states that over the past 48 hours he developed gradual onset L sided facial numbness/weakness w/ facial droop which significant worsened last evening and has now been constant. He was seen at [facility] yesterday for his constellation of sxs and started on Amoxicillin TID for suspected AOM. Has had 5 doses of Amoxicillin thus far. He states that he is here today due to the numbness/weaknesss. Other than the face no other numbness/weakness, specifically no extremity sxs. Denies change in vision, dizziness, change in speech, fever, chills, nausea, or vomiting.      HPI  Past Medical History:  Diagnosis Date  . Vertigo     There are no active problems to display for this patient.   History reviewed. No pertinent surgical history.      Home Medications    Prior to Admission medications   Medication Sig Start Date End Date Taking? Authorizing Provider  amoxicillin (AMOXIL) 500 MG capsule Take 2 capsules (1,000 mg total) by mouth 3 (three) times daily for 5 days. 06/11/18 06/16/18  Dahlia Byes A, NP  tobramycin (TOBREX) 0.3 % ophthalmic solution Place 1 drop into the right eye every 4 (four) hours. Place one drop in the affected eye every 4 hours for 7 days 07/19/14   Antony Madura, PA-C    Family History No family history on file.  Social History Social History   Tobacco Use  . Smoking status: Current Some Day Smoker    Types: Cigarettes  . Smokeless  tobacco: Never Used  Substance Use Topics  . Alcohol use: Yes    Comment: occasionally  . Drug use: Yes    Types: Marijuana     Allergies   Patient has no known allergies.   Review of Systems Review of Systems  Constitutional: Negative for chills and fever.  HENT: Positive for ear pain. Negative for congestion, drooling, ear discharge, sore throat, trouble swallowing and voice change.   Respiratory: Negative for shortness of breath.   Cardiovascular: Negative for chest pain.  Gastrointestinal: Negative for blood in stool, diarrhea, nausea and vomiting.  Neurological: Positive for facial asymmetry, weakness (facial) and numbness (facial). Negative for dizziness, tremors, seizures, syncope and speech difficulty.  All other systems reviewed and are negative.  Physical Exam Updated Vital Signs BP 125/79 (BP Location: Right Arm)   Pulse 71   Temp 98.1 F (36.7 C) (Oral)   Resp 16   Wt 88.5 kg   SpO2 98%   BMI 23.75 kg/m   Physical Exam Vitals signs and nursing note reviewed.  Constitutional:      General: He is not in acute distress.    Appearance: He is well-developed. He is not toxic-appearing.  HENT:     Head: Normocephalic and atraumatic.     Right Ear: Tympanic membrane, ear canal and external ear normal. Tympanic membrane is not perforated, erythematous, retracted or bulging.     Left Ear: External ear normal. Tympanic  membrane is not perforated, erythematous, retracted or bulging.     Ears:     Comments: External ears without erythema/swelling.  No mastoid erythema/warmth/tenderness to palpation.  L EAC w/ some mild erythema adjacent to the TM, but no obvious vesicular lesions. No purulent drainage or canal swelling.  L TM w/ minimal fullness/fluid- no erythema/retracing/bulging.     Nose: Nose normal.  Eyes:     General:        Right eye: No discharge.        Left eye: No discharge.     Extraocular Movements: Extraocular movements intact.      Conjunctiva/sclera: Conjunctivae normal.     Pupils: Pupils are equal, round, and reactive to light.  Neck:     Musculoskeletal: Neck supple.  Cardiovascular:     Rate and Rhythm: Normal rate and regular rhythm.  Pulmonary:     Effort: Pulmonary effort is normal. No respiratory distress.     Breath sounds: Normal breath sounds. No wheezing, rhonchi or rales.  Abdominal:     General: There is no distension.     Palpations: Abdomen is soft.     Tenderness: There is no abdominal tenderness.  Skin:    General: Skin is warm and dry.     Findings: No rash.  Neurological:     Mental Status: He is alert.     Comments: Clear speech. PERRL. EOMI. On exam patient appears to have a left sided facial droop with forehead involvement and difficulty closing the ipsilateral eye, there are no other focal neurologic deficits, no extremity motor/sensory deficits. Normal finger to nose. Negative pronator drift. Ambulatory w/ steady gait.   Psychiatric:        Behavior: Behavior normal.     ED Treatments / Results  Labs (all labs ordered are listed, but only abnormal results are displayed) Labs Reviewed - No data to display  EKG None  Radiology No results found.  Procedures Procedures (including critical care time)  Medications Ordered in ED Medications - No data to display   Initial Impression / Assessment and Plan / ED Course  I have reviewed the triage vital signs and the nursing notes.  Pertinent labs & imaging results that were available during my care of the patient were reviewed by me and considered in my medical decision making (see chart for details).  Patient presents to the emergency department with L sided facial droop, H&P appear consistent with Bell's palsy. Patient nontoxic appearing, resting comfortably, vitals WNL. Patient receiving tx for AOM- on my exam TM appears fairly normal- minimal fullness- no erythema/bulging/retraction/perf. No signs of mastoiditis. L EAC has some  mild erythema adjacent to the TM- not a definitive vesicular lesion, not convincing for AOE.  On exam patient appears to have L sided facial droop with forehead involvement and difficulty closing the ipsilateral eye, there are no other focal neurologic deficits, no extremity motor/sensory deficits, negative pronator drift, therefore doubt acute CVA. Suspect Bells Palsy.   Will treat for Bell's palsy, possibly degree of Ramsay Hunt w/ EAC erythema, but no clear vesicular lesions, will tx w/ prednisone burst as well as valacyclovir (1000 mg TID for 7 days) . Artificial tears prescribed recommended keeping the affected eye taped closed when sleeping at night to preserve moisture.  Close PCP follow-up.  I discussed treatment plan, need for PCP follow-up, and return precautions with the patient. Provided opportunity for questions, patient confirmed understanding and is in agreement with plan.     Findings  and plan of care discussed with supervising physician Dr. Madilyn Hookees who personally evaluated and examined this patient and is in agreement with plan.   Final Clinical Impressions(s) / ED Diagnoses   Final diagnoses:  Bell's palsy    ED Discharge Orders         Ordered    valACYclovir (VALTREX) 1000 MG tablet  3 times daily     06/12/18 1511    predniSONE (DELTASONE) 50 MG tablet  Daily with breakfast     06/12/18 1511    hydroxypropyl methylcellulose / hypromellose (ISOPTO TEARS / GONIOVISC) 2.5 % ophthalmic solution  As needed     06/12/18 1511           Jermar Colter, Pleas KochSamantha R, PA-C 06/12/18 1538    Tilden Fossaees, Elizabeth, MD 06/13/18 1043

## 2018-06-12 NOTE — Discharge Instructions (Signed)
You were seen in the emergency department today for facial droop, it appears that you have Bell's palsy.- please see the attached handout for further information regarding this diagnosis.   We are treating this with two prescription medications:  - Prednisone, a steroid, take this daily for the next 5 days.  - Valtrex, an antiviral medicine, take this 3 times per day for the next 7 days. - Artificial tears-please use eyedrops as needed for dry eye left eye- use at minimum 6 times per day.  Please be sure to use these prior to bed and frequently throughout the day  We have prescribed you new medication(s) today. Discuss the medications prescribed today with your pharmacist as they can have adverse effects and interactions with your other medicines including over the counter and prescribed medications. Seek medical evaluation if you start to experience new or abnormal symptoms after taking one of these medicines, seek care immediately if you start to experience difficulty breathing, feeling of your throat closing, facial swelling, or rash as these could be indications of a more serious allergic reaction   Please tape your left eye closed when sleeping at night to avoid over drying.   We would like you to follow-up closely with your primary care provider within 3 days for reevaluation. It may be necessary for you to follow-up with an eye specialist and/or a neurologist, we have given you information for each of these types of specialists, discuss with your primary care provider to see if they feel this is necessary based on your reevaluation. Return to the ER for new or worsening symptoms including but not limited to numbness or weakness to your arms or legs, change in your vision, fever, problems with hearing or ear pain, or any other concerns that you may have.

## 2018-06-12 NOTE — ED Triage Notes (Signed)
Pt in with c/o L ear pain/infection. Has been on Amoxicillin x 3 days. New L facial weakness x 2 nights. Denies any fevers, no other neuro deficits

## 2019-04-25 ENCOUNTER — Ambulatory Visit: Payer: Self-pay

## 2019-05-13 ENCOUNTER — Encounter (HOSPITAL_COMMUNITY): Payer: Self-pay

## 2019-05-13 ENCOUNTER — Emergency Department (HOSPITAL_COMMUNITY)
Admission: EM | Admit: 2019-05-13 | Discharge: 2019-05-13 | Disposition: A | Discharge status: Home / Self Care | Payer: No Typology Code available for payment source | Attending: Emergency Medicine | Admitting: Emergency Medicine

## 2019-05-13 ENCOUNTER — Other Ambulatory Visit: Payer: Self-pay

## 2019-05-13 ENCOUNTER — Emergency Department (HOSPITAL_BASED_OUTPATIENT_CLINIC_OR_DEPARTMENT_OTHER): Payer: No Typology Code available for payment source

## 2019-05-13 DIAGNOSIS — Z720 Tobacco use: Secondary | ICD-10-CM | POA: Insufficient documentation

## 2019-05-13 DIAGNOSIS — M79605 Pain in left leg: Secondary | ICD-10-CM

## 2019-05-13 DIAGNOSIS — L03116 Cellulitis of left lower limb: Secondary | ICD-10-CM | POA: Insufficient documentation

## 2019-05-13 DIAGNOSIS — M79662 Pain in left lower leg: Secondary | ICD-10-CM | POA: Diagnosis present

## 2019-05-13 DIAGNOSIS — M79609 Pain in unspecified limb: Secondary | ICD-10-CM | POA: Diagnosis not present

## 2019-05-13 DIAGNOSIS — M7989 Other specified soft tissue disorders: Secondary | ICD-10-CM | POA: Diagnosis not present

## 2019-05-13 MED ORDER — TRAMADOL HCL 50 MG PO TABS: 50.0000 mg | ORAL_TABLET | Freq: Four times a day (QID) | ORAL | 0 refills | Status: DC | PRN

## 2019-05-13 MED ORDER — CEPHALEXIN 500 MG PO CAPS: 1000.0000 mg | ORAL_CAPSULE | Freq: Two times a day (BID) | ORAL | 0 refills | Status: DC

## 2019-05-13 NOTE — ED Provider Notes (Signed)
[facility] EMERGENCY DEPARTMENT Provider Note   CSN: [id number] Arrival date & time: 05/13/19  1523     History Chief Complaint  Patient presents with  . Leg Pain    Tyler Ellison is a 34 y.o. male.  HPI Patient presents to the emergency department with pain in the left anterior lower leg.  The patient states that about a week ago.  He states that some redness and swelling in the area.  The patient was seen in urgent care they got injured on the job from twisting his ankle of that leg.  Patient states that he has no injuries.  Patient states that he had no wounds to the lower leg recently.  The patient states that palpation makes the pain worse.  Patient denies fever, nausea, vomiting, weakness, dizziness, headache, blurred vision or syncope.    Past Medical History:  Diagnosis Date  . Vertigo     There are no problems to display for this patient.   History reviewed. No pertinent surgical history.     No family history on file.  Social History   Tobacco Use  . Smoking status: Current Some Day Smoker    Types: Cigarettes  . Smokeless tobacco: Never Used  Substance Use Topics  . Alcohol use: Yes    Comment: occasionally  . Drug use: Yes    Types: Marijuana    Home Medications Prior to Admission medications   Medication Sig Start Date End Date Taking? Authorizing Provider  hydroxypropyl methylcellulose / hypromellose (ISOPTO TEARS / GONIOVISC) 2.5 % ophthalmic solution Place 1 drop into the left eye as needed for dry eyes. 06/12/18   Petrucelli, Pleas Koch, PA-C  predniSONE (DELTASONE) 50 MG tablet Take 1 tablet (50 mg total) by mouth daily with breakfast. 06/12/18   Petrucelli, Samantha R, PA-C  tobramycin (TOBREX) 0.3 % ophthalmic solution Place 1 drop into the right eye every 4 (four) hours. Place one drop in the affected eye every 4 hours for 7 days 07/19/14   Antony Madura, PA-C  valACYclovir (VALTREX) 1000 MG tablet Take 1 tablet (1,000 mg total)  by mouth 3 (three) times daily. 06/12/18   Petrucelli, Pleas Koch, PA-C    Allergies    Patient has no known allergies.  Review of Systems   Review of Systems All other systems negative except as documented in the HPI. All pertinent positives and negatives as reviewed in the HPI.  Physical Exam Updated Vital Signs BP 121/75 (BP Location: Right Arm)   Pulse 82   Temp 98.3 F (36.8 C) (Oral)   Resp 12   Ht 6\' 3"  (1.905 m)   Wt 83.9 kg   SpO2 98%   BMI 23.12 kg/m   Physical Exam Vitals and nursing note reviewed.  Constitutional:      General: He is not in acute distress.    Appearance: He is well-developed.  HENT:     Head: Normocephalic and atraumatic.  Eyes:     Pupils: Pupils are equal, round, and reactive to light.  Pulmonary:     Effort: Pulmonary effort is normal.  Musculoskeletal:       Legs:  Skin:    General: Skin is warm and dry.  Neurological:     Mental Status: He is alert and oriented to person, place, and time.     ED Results / Procedures / Treatments   Labs (all labs ordered are listed, but only abnormal results are displayed) Labs Reviewed - No data to  display  EKG None  Radiology VAS [place] LOWER EXTREMITY VENOUS (DVT) (ONLY [facility])  Result Date: 05/13/2019  Lower Venous DVTStudy Indications: Swelling, and Pain.  Comparison Study: no prior Performing Technologist: Jeb Levering RDMS, RVT  Examination Guidelines: A complete evaluation includes B-mode imaging, spectral Doppler, color Doppler, and power Doppler as needed of all accessible portions of each vessel. Bilateral testing is considered an integral part of a complete examination. Limited examinations for reoccurring indications may be performed as noted. The reflux portion of the exam is performed with the patient in reverse Trendelenburg.  +---------+---------------+---------+-----------+----------+--------------+ LEFT     CompressibilityPhasicitySpontaneityPropertiesThrombus Aging  +---------+---------------+---------+-----------+----------+--------------+ CFV      Full           Yes      Yes                                 +---------+---------------+---------+-----------+----------+--------------+ SFJ      Full                                                        +---------+---------------+---------+-----------+----------+--------------+ FV Prox  Full                                                        +---------+---------------+---------+-----------+----------+--------------+ FV Mid   Full                                                        +---------+---------------+---------+-----------+----------+--------------+ FV DistalFull                                                        +---------+---------------+---------+-----------+----------+--------------+ PFV      Full                                                        +---------+---------------+---------+-----------+----------+--------------+ POP      Full           Yes      Yes                                 +---------+---------------+---------+-----------+----------+--------------+ PTV      Full                                                        +---------+---------------+---------+-----------+----------+--------------+ PERO     Full                                                        +---------+---------------+---------+-----------+----------+--------------+  Summary: LEFT: - There is no evidence of deep vein thrombosis in the lower extremity.  - No cystic structure found in the popliteal fossa.  *See table(s) above for measurements and observations. Electronically signed by Harold Barban MD on 05/13/2019 at 5:43:49 PM.    Final     Procedures Procedures (including critical care time)  Medications Ordered in ED Medications - No data to display  ED Course  I have reviewed the triage vital signs and the nursing notes.  Pertinent labs &  imaging results that were available during my care of the patient were reviewed by me and considered in my medical decision making (see chart for details).    MDM Rules/Calculators/A&P                      The patient will be treated for cellulitis that I feel like is mild at this point.  I told the patient that this is not definitive and this is just treating based off of the symptoms and the appearance of the area.  Advised him to keep a close watch on the area and return here for any worsening in his condition.  The patient does not have a blood clot based on his ultrasound of the lower extremity.  I advised the patient that at this point I do not know of any other entity that would cause this type of issue.  Patient agrees the plan and all questions were answered.  Final Clinical Impression(s) / ED Diagnoses Final diagnoses:  None    Rx / DC Orders ED Discharge Orders    None       Dalia Heading, PA-C 05/13/19 1815    Dorie Rank, MD 05/15/19 1553

## 2019-05-13 NOTE — ED Triage Notes (Signed)
Pt reports left lower leg pain that startes about 1 week ago, redness and swelling. Pt sent here by UC to rule out a blood clot. Pt ambulatory with crutches

## 2019-05-13 NOTE — Progress Notes (Signed)
Lower venous duplex       has been completed. Preliminary results can be found under CV proc through chart review. Novis League, BS, RDMS, RVT   

## 2019-05-13 NOTE — Discharge Instructions (Signed)
Return here as needed.  Follow-up with the urgent care.  Use heat over the area.

## 2019-05-31 ENCOUNTER — Encounter (HOSPITAL_COMMUNITY): Payer: Self-pay

## 2019-05-31 ENCOUNTER — Other Ambulatory Visit: Payer: Self-pay

## 2019-05-31 ENCOUNTER — Ambulatory Visit (HOSPITAL_COMMUNITY)
Admission: EM | Admit: 2019-05-31 | Discharge: 2019-05-31 | Disposition: A | Discharge status: Home / Self Care | Payer: Self-pay | Attending: Family Medicine | Admitting: Family Medicine

## 2019-05-31 DIAGNOSIS — M25552 Pain in left hip: Secondary | ICD-10-CM

## 2019-05-31 DIAGNOSIS — L0291 Cutaneous abscess, unspecified: Secondary | ICD-10-CM

## 2019-05-31 MED ORDER — SULFAMETHOXAZOLE-TRIMETHOPRIM 800-160 MG PO TABS: 1.0000 | ORAL_TABLET | Freq: Two times a day (BID) | ORAL | 0 refills | Status: AC

## 2019-05-31 MED ORDER — TRAMADOL HCL 50 MG PO TABS: 50.0000 mg | ORAL_TABLET | Freq: Four times a day (QID) | ORAL | 0 refills | Status: DC | PRN

## 2019-05-31 NOTE — ED Provider Notes (Signed)
[facility]    CSN: [id number] Arrival date & time: 05/31/19  1121      History   Chief Complaint Chief Complaint  Patient presents with  . Abscess    HPI Tyler Ellison is a 34 y.o. male.   Reports today with abscess to the left buttock.  Reports that he has tried to squeeze it and open it.  Reports that this is causing him a lot of pain.  Reports that he has had a "knot" in the area for the last almost year, and that it appeared to have a head on it within the last week.  Reports that the area is red and tender.  Denies attempts to treat at home other than trying to open the area on his own.  Denies headache, cough, shortness of breath, nausea, vomiting, diarrhea, rash, fever, other symptoms.  Per chart review, patient has history of cellulitis of the left leg, it was at the lower leg and he was seen in the ER for this on 05/13/2019.  Patient reports that this is a different area and a different type of pain.  Per chart review, no other significant medical history.  ROS per HPI  The history is provided by the patient.    Past Medical History:  Diagnosis Date  . Vertigo     There are no problems to display for this patient.   History reviewed. No pertinent surgical history.     Home Medications    Prior to Admission medications   Medication Sig Start Date End Date Taking? Authorizing Provider  cephALEXin (KEFLEX) 500 MG capsule Take 2 capsules (1,000 mg total) by mouth 2 (two) times daily. 05/13/19   Lawyer, Cristal Deer, PA-C  hydroxypropyl methylcellulose / hypromellose (ISOPTO TEARS / GONIOVISC) 2.5 % ophthalmic solution Place 1 drop into the left eye as needed for dry eyes. 06/12/18   Petrucelli, Pleas Koch, PA-C  predniSONE (DELTASONE) 50 MG tablet Take 1 tablet (50 mg total) by mouth daily with breakfast. 06/12/18   Petrucelli, Samantha R, PA-C  sulfamethoxazole-trimethoprim (BACTRIM DS) 800-160 MG tablet Take 1 tablet by mouth 2 (two) times daily for 7 days.  05/31/19 06/07/19  Moshe Cipro, NP  tobramycin (TOBREX) 0.3 % ophthalmic solution Place 1 drop into the right eye every 4 (four) hours. Place one drop in the affected eye every 4 hours for 7 days 07/19/14   Antony Madura, PA-C  traMADol (ULTRAM) 50 MG tablet Take 1 tablet (50 mg total) by mouth every 6 (six) hours as needed. 05/31/19   Moshe Cipro, NP  valACYclovir (VALTREX) 1000 MG tablet Take 1 tablet (1,000 mg total) by mouth 3 (three) times daily. 06/12/18   Petrucelli, Pleas Koch, PA-C    Family History History reviewed. No pertinent family history.  Social History Social History   Tobacco Use  . Smoking status: Current Some Day Smoker    Types: Cigarettes  . Smokeless tobacco: Never Used  Substance Use Topics  . Alcohol use: Yes    Comment: occasionally  . Drug use: Yes    Types: Marijuana     Allergies   Patient has no known allergies.   Review of Systems Review of Systems   Physical Exam Triage Vital Signs ED Triage Vitals [05/31/19 1244]  Enc Vitals Group     BP 124/82     Pulse Rate 93     Resp 18     Temp 97.8 F (36.6 C)     Temp Source Oral  SpO2 96 %     Weight      Height      Head Circumference      Peak Flow      Pain Score 6     Pain Loc      Pain Edu?      Excl. in [place]?    No data found.  Updated Vital Signs BP 124/82 (BP Location: Right Arm)   Pulse 93   Temp 97.8 F (36.6 C) (Oral)   Resp 18   SpO2 96%   Visual Acuity Right Eye Distance:   Left Eye Distance:   Bilateral Distance:    Right Eye Near:   Left Eye Near:    Bilateral Near:     Physical Exam Vitals and nursing note reviewed.  Constitutional:      General: He is not in acute distress.    Appearance: Normal appearance. He is well-developed and normal weight. He is not ill-appearing.  HENT:     Head: Normocephalic and atraumatic.  Eyes:     Extraocular Movements: Extraocular movements intact.     Conjunctiva/sclera: Conjunctivae normal.     Pupils:  Pupils are equal, round, and reactive to light.  Cardiovascular:     Rate and Rhythm: Normal rate and regular rhythm.     Heart sounds: Normal heart sounds. No murmur.  Pulmonary:     Effort: Pulmonary effort is normal. No respiratory distress.     Breath sounds: Normal breath sounds. No stridor. No wheezing, rhonchi or rales.  Chest:     Chest wall: No tenderness.  Abdominal:     General: Bowel sounds are normal.     Palpations: Abdomen is soft.     Tenderness: There is no abdominal tenderness.  Musculoskeletal:        General: Normal range of motion.     Cervical back: Normal range of motion and neck supple.  Skin:    General: Skin is warm and dry.     Capillary Refill: Capillary refill takes less than 2 seconds.     Findings: Abscess and erythema present.       Neurological:     General: No focal deficit present.     Mental Status: He is alert and oriented to person, place, and time.  Psychiatric:        Mood and Affect: Mood normal.        Behavior: Behavior normal.      UC Treatments / Results  Labs (all labs ordered are listed, but only abnormal results are displayed) Labs Reviewed  AEROBIC CULTURE (SUPERFICIAL SPECIMEN)    EKG   Radiology No results found.  Procedures Incision and Drainage  Date/Time: 06/01/2019 9:48 AM Performed by: Faustino Congress, NP Authorized by: Faustino Congress, NP   Consent:    Consent obtained:  Verbal   Consent given by:  Patient   Risks discussed:  Infection, incomplete drainage and pain   Alternatives discussed:  Alternative treatment and referral Location:    Type:  Abscess   Size:  6 x 6 cm   Location:  Lower extremity   Lower extremity location:  Hip   Hip location:  L hip Anesthesia (see MAR for exact dosages):    Anesthesia method:  Local infiltration   Local anesthetic:  Lidocaine 2% w/o epi Procedure details:    Needle aspiration: no     Incision types:  Single straight   Incision depth:  Dermal    Scalpel blade:  10   Wound management:  Probed and deloculated and irrigated with saline   Drainage:  Bloody and purulent   Drainage amount:  Moderate   Wound treatment:  Wound left open   Packing materials:  1/2 in iodoform gauze   Amount 1/2" iodoform:  2 inches Post-procedure details:    Patient tolerance of procedure:  Tolerated well, no immediate complications   (including critical care time)  Medications Ordered in UC Medications - No data to display  Initial Impression / Assessment and Plan / UC Course  I have reviewed the triage vital signs and the nursing notes.  Pertinent labs & imaging results that were available during my care of the patient were reviewed by me and considered in my medical decision making (see chart for details).     L buttock pain Abscess: Presents with abscess to the left hip buttock area that has been there for the last year.  Patient has tried to open the area on his own in the last week, he noticed that the area developed like he could squeeze something out of.  He has been doing warm compresses at home.  He has a recent history of cellulitis of the left lower leg and was seen in the ER and treated for this on 05/13/2019.  On exam, patient is alert, no acute distress.  Lungs CTA bilaterally, heart sounds normal S1-S2.  No abdominal tenderness noted.  Patient is afebrile in office today.  Area of abscess to the patient's left hip/buttock and measures about 6 x 6 cm in diameter including area of erythema and induration.  Area incised and drained in the office today.  There were large amounts of yellow/green substance expressed from the abscess.  There was also blood and purulent fluid.  Abscess was probed and deloculated.  Abscess was explored, cleaned, and dressed in the office today.  Discussed with patient that the abscess may come back, may need surgical removal as well.  Patient instructed to follow-up on Tuesday to recheck the healing of this abscess.   Patient instructed that if he notices any red streaking going down his leg or up or his back, that he is to report to the ER for possible need for IV antibiotics.  Prescribed Bactrim today twice daily for 7 days.  Also prescribed tramadol 50 mg 1 tablet every 6 hours as needed for moderate to severe pain with this area.  Patient verbalizes understanding and is in agreement with treatment plan. Final Clinical Impressions(s) / UC Diagnoses   Final diagnoses:  Abscess     Discharge Instructions     I have sent in Bactrim for you to take twice daily for the next 7 days.  If you notice red streaking coming from the area up your side or down your leg, go to the ER for further evaluation and possible IV antibiotics.  Follow up with this office on Tuesday morning.     ED Prescriptions    Medication Sig Dispense Auth. Provider   sulfamethoxazole-trimethoprim (BACTRIM DS) 800-160 MG tablet Take 1 tablet by mouth 2 (two) times daily for 7 days. 14 tablet Moshe Cipro, NP   traMADol (ULTRAM) 50 MG tablet Take 1 tablet (50 mg total) by mouth every 6 (six) hours as needed. 15 tablet Moshe Cipro, NP     I have reviewed the PDMP during this encounter.   Moshe Cipro, NP 06/01/19 [phone]

## 2019-05-31 NOTE — ED Triage Notes (Signed)
Pt presents with abscess on left buttocks for about a year but has become irritated and started draining in past week.

## 2019-05-31 NOTE — Discharge Instructions (Signed)
I have sent in Bactrim for you to take twice daily for the next 7 days.  If you notice red streaking coming from the area up your side or down your leg, go to the ER for further evaluation and possible IV antibiotics.  Follow up with this office on Tuesday morning.

## 2019-06-01 DIAGNOSIS — L0291 Cutaneous abscess, unspecified: Secondary | ICD-10-CM

## 2019-06-01 LAB — AEROBIC CULTURE  (SUPERFICIAL SPECIMEN)

## 2019-06-02 ENCOUNTER — Other Ambulatory Visit: Payer: Self-pay

## 2019-06-02 ENCOUNTER — Ambulatory Visit (HOSPITAL_COMMUNITY)
Admission: EM | Admit: 2019-06-02 | Discharge: 2019-06-02 | Disposition: A | Discharge status: Home / Self Care | Payer: Self-pay

## 2019-06-02 ENCOUNTER — Encounter (HOSPITAL_COMMUNITY): Payer: Self-pay | Admitting: Emergency Medicine

## 2019-06-02 DIAGNOSIS — Z5189 Encounter for other specified aftercare: Secondary | ICD-10-CM

## 2019-06-02 DIAGNOSIS — Z4800 Encounter for change or removal of nonsurgical wound dressing: Secondary | ICD-10-CM

## 2019-06-02 LAB — AEROBIC CULTURE W GRAM STAIN (SUPERFICIAL SPECIMEN): Culture: NORMAL

## 2019-06-02 NOTE — ED Triage Notes (Signed)
First attempted at calling patient for triage @ 1640

## 2019-06-02 NOTE — Discharge Instructions (Signed)
Abscess and cellulitis appear well-healing I want you to continue antibiotics as prescribed Massage the area while showering You may shower and cleanse the area daily with a bandage.  Continue to bandage the wound throughout the day.  If you notice reaccumulation, worsening redness, worsening drainage, develop fever chills please return for reevaluation.

## 2019-06-02 NOTE — ED Provider Notes (Signed)
[place]    CSN: [id number] Arrival date & time: 06/02/19  1628      History   Chief Complaint Chief Complaint  Patient presents with  . packing removal    HPI Tyler Ellison is a 34 y.o. male.   Patient presents for wound check and packing removal of abscess on left hip.  Patient had abscess drained on 05/31/2019.  Packing was placed.  He was started on Bactrim twice daily.  He reports taking 3 doses so far.  He reports he has had improved pain without issue the area of the abscess.  Reports minimal drainage and no bleeding.  He also believes that the cellulitis that was on his left leg as largely improved.  Denies any fever or chills.     Past Medical History:  Diagnosis Date  . Vertigo     There are no problems to display for this patient.   History reviewed. No pertinent surgical history.     Home Medications    Prior to Admission medications   Medication Sig Start Date End Date Taking? Authorizing Provider  cephALEXin (KEFLEX) 500 MG capsule Take 2 capsules (1,000 mg total) by mouth 2 (two) times daily. 05/13/19   Lawyer, Harrell Gave, PA-C  hydroxypropyl methylcellulose / hypromellose (ISOPTO TEARS / GONIOVISC) 2.5 % ophthalmic solution Place 1 drop into the left eye as needed for dry eyes. 06/12/18   Petrucelli, Glynda Jaeger, PA-C  predniSONE (DELTASONE) 50 MG tablet Take 1 tablet (50 mg total) by mouth daily with breakfast. 06/12/18   Petrucelli, Samantha R, PA-C  sulfamethoxazole-trimethoprim (BACTRIM DS) 800-160 MG tablet Take 1 tablet by mouth 2 (two) times daily for 7 days. 05/31/19 06/07/19  Faustino Congress, NP  tobramycin (TOBREX) 0.3 % ophthalmic solution Place 1 drop into the right eye every 4 (four) hours. Place one drop in the affected eye every 4 hours for 7 days 07/19/14   Antonietta Breach, PA-C  traMADol (ULTRAM) 50 MG tablet Take 1 tablet (50 mg total) by mouth every 6 (six) hours as needed. 05/31/19   Faustino Congress, NP  valACYclovir (VALTREX)  1000 MG tablet Take 1 tablet (1,000 mg total) by mouth 3 (three) times daily. 06/12/18   Petrucelli, Glynda Jaeger, PA-C    Family History No family history on file.  Social History Social History   Tobacco Use  . Smoking status: Current Some Day Smoker    Types: Cigarettes  . Smokeless tobacco: Never Used  Substance Use Topics  . Alcohol use: Yes    Comment: occasionally  . Drug use: Yes    Types: Marijuana     Allergies   Patient has no known allergies.   Review of Systems Review of Systems  Per HPI Physical Exam Triage Vital Signs ED Triage Vitals  Enc Vitals Group     BP 06/02/19 1659 130/78     Pulse Rate 06/02/19 1659 (!) 56     Resp 06/02/19 1659 16     Temp 06/02/19 1659 98.4 F (36.9 C)     Temp Source 06/02/19 1659 Oral     SpO2 06/02/19 1659 98 %     Weight --      Height --      Head Circumference --      Peak Flow --      Pain Score 06/02/19 1658 0     Pain Loc --      Pain Edu? --      Excl. in [place]? --  No data found.  Updated Vital Signs BP 130/78   Pulse (!) 56   Temp 98.4 F (36.9 C) (Oral)   Resp 16   SpO2 98%   Visual Acuity Right Eye Distance:   Left Eye Distance:   Bilateral Distance:    Right Eye Near:   Left Eye Near:    Bilateral Near:     Physical Exam Vitals and nursing note reviewed.  Constitutional:      General: He is not in acute distress.    Appearance: Normal appearance. He is not ill-appearing.  HENT:     Head: Normocephalic and atraumatic.  Cardiovascular:     Rate and Rhythm: Normal rate.  Pulmonary:     Effort: Pulmonary effort is normal. No respiratory distress.  Skin:         Comments: Area marked on diagram for location of incision from I&D.  Packing in place.  Removed without issue.  There is scant amount of green-yellow discharge.  Pressure applied with a scant amount of continued discharge.  No bleeding.  Does appear to be resolving cellulitis on the left lateral leg.   Neurological:      General: No focal deficit present.     Mental Status: He is alert and oriented to person, place, and time.      UC Treatments / Results  Labs (all labs ordered are listed, but only abnormal results are displayed) Labs Reviewed - No data to display  EKG   Radiology No results found.  Procedures Procedures (including critical care time)  Medications Ordered in UC Medications - No data to display  Initial Impression / Assessment and Plan / UC Course  I have reviewed the triage vital signs and the nursing notes.  Pertinent labs & imaging results that were available during my care of the patient were reviewed by me and considered in my medical decision making (see chart for details).     #Wound check #Abscess packing removal Patient is 34 year old presenting with recent abscess that was I&D and on 05/31/2019.  Appears well-healing.  Packing was removed, appears wound will remain open for a few days to allow continued drainage.  Encourage patient to massage the area to further drainage.  Patient to continue Bactrim therapy.  Cellulitis appears well-healing.  Patient to return if notices reaccumulation, increasing drainage, fever or chills.  Patient advised if it is well-healing and he has no concerns to continue to monitor.  He verbalized understanding this plan of care. Final Clinical Impressions(s) / UC Diagnoses   Final diagnoses:  Wound check, abscess  Abscess packing removal     Discharge Instructions     Abscess and cellulitis appear well-healing I want you to continue antibiotics as prescribed Massage the area while showering You may shower and cleanse the area daily with a bandage.  Continue to bandage the wound throughout the day.  If you notice reaccumulation, worsening redness, worsening drainage, develop fever chills please return for reevaluation.      ED Prescriptions    None     PDMP not reviewed this encounter.   Hermelinda Medicus, PA-C 06/02/19  1730

## 2019-06-02 NOTE — ED Triage Notes (Signed)
Packing placed Sunday morning. He just got antibiotics yesterday at noon and has taken 3 doses so far.

## 2023-02-08 ENCOUNTER — Emergency Department (HOSPITAL_COMMUNITY): Payer: Self-pay

## 2023-02-08 ENCOUNTER — Encounter (HOSPITAL_COMMUNITY): Payer: Self-pay

## 2023-02-08 ENCOUNTER — Other Ambulatory Visit: Payer: Self-pay

## 2023-02-08 ENCOUNTER — Inpatient Hospital Stay (HOSPITAL_COMMUNITY)
Admission: EM | Admit: 2023-02-08 | Discharge: 2023-02-11 | DRG: 309 | Disposition: A | Discharge status: Home / Self Care | Payer: Self-pay | Attending: Cardiology | Admitting: Cardiology

## 2023-02-08 DIAGNOSIS — I3139 Other pericardial effusion (noninflammatory): Secondary | ICD-10-CM | POA: Diagnosis present

## 2023-02-08 DIAGNOSIS — Z79899 Other long term (current) drug therapy: Secondary | ICD-10-CM | POA: Diagnosis not present

## 2023-02-08 DIAGNOSIS — S01112A Laceration without foreign body of left eyelid and periocular area, initial encounter: Secondary | ICD-10-CM | POA: Diagnosis present

## 2023-02-08 DIAGNOSIS — I071 Rheumatic tricuspid insufficiency: Secondary | ICD-10-CM | POA: Diagnosis present

## 2023-02-08 DIAGNOSIS — I77810 Thoracic aortic ectasia: Secondary | ICD-10-CM | POA: Diagnosis present

## 2023-02-08 DIAGNOSIS — R531 Weakness: Secondary | ICD-10-CM

## 2023-02-08 DIAGNOSIS — I480 Paroxysmal atrial fibrillation: Secondary | ICD-10-CM | POA: Diagnosis present

## 2023-02-08 DIAGNOSIS — N179 Acute kidney failure, unspecified: Secondary | ICD-10-CM | POA: Diagnosis present

## 2023-02-08 DIAGNOSIS — Z7901 Long term (current) use of anticoagulants: Secondary | ICD-10-CM

## 2023-02-08 DIAGNOSIS — I4891 Unspecified atrial fibrillation: Secondary | ICD-10-CM | POA: Diagnosis present

## 2023-02-08 DIAGNOSIS — Z7952 Long term (current) use of systemic steroids: Secondary | ICD-10-CM | POA: Diagnosis not present

## 2023-02-08 DIAGNOSIS — S43102A Unspecified dislocation of left acromioclavicular joint, initial encounter: Secondary | ICD-10-CM

## 2023-02-08 DIAGNOSIS — Y92410 Unspecified street and highway as the place of occurrence of the external cause: Secondary | ICD-10-CM

## 2023-02-08 DIAGNOSIS — F17210 Nicotine dependence, cigarettes, uncomplicated: Secondary | ICD-10-CM | POA: Diagnosis present

## 2023-02-08 DIAGNOSIS — S0181XA Laceration without foreign body of other part of head, initial encounter: Secondary | ICD-10-CM

## 2023-02-08 HISTORY — DX: Unspecified atrial fibrillation: I48.91

## 2023-02-08 LAB — COMPREHENSIVE METABOLIC PANEL
ALT: 16 U/L (ref 0–44)
AST: 26 U/L (ref 15–41)
Albumin: 4.3 g/dL (ref 3.5–5.0)
Alkaline Phosphatase: 41 U/L (ref 38–126)
Anion gap: 13 (ref 5–15)
BUN: 16 mg/dL (ref 6–20)
CO2: 20 mmol/L — ABNORMAL LOW (ref 22–32)
Calcium: 9.1 mg/dL (ref 8.9–10.3)
Chloride: 104 mmol/L (ref 98–111)
Creatinine, Ser: 1.35 mg/dL — ABNORMAL HIGH (ref 0.61–1.24)
GFR, Estimated: >60 mL/min (ref >60)
Glucose, Bld: 108 mg/dL — ABNORMAL HIGH (ref 70–99)
Potassium: 3.8 mmol/L (ref 3.5–5.1)
Sodium: 137 mmol/L (ref 135–145)
Total Bilirubin: 0.8 mg/dL (ref 0.0–1.2)
Total Protein: 7 g/dL (ref 6.5–8.1)

## 2023-02-08 LAB — I-STAT CHEM 8, ED
BUN: 20 mg/dL (ref 6–20)
Calcium, Ion: 1.04 mmol/L — ABNORMAL LOW (ref 1.15–1.40)
Chloride: 107 mmol/L (ref 98–111)
Creatinine, Ser: 1.5 mg/dL — ABNORMAL HIGH (ref 0.61–1.24)
Glucose, Bld: 102 mg/dL — ABNORMAL HIGH (ref 70–99)
HCT: 47 % (ref 39.0–52.0)
Hemoglobin: 16 g/dL (ref 13.0–17.0)
Potassium: 4 mmol/L (ref 3.5–5.1)
Sodium: 139 mmol/L (ref 135–145)
TCO2: 23 mmol/L (ref 22–32)

## 2023-02-08 LAB — CBC
HCT: 47.3 % (ref 39.0–52.0)
Hemoglobin: 15.7 g/dL (ref 13.0–17.0)
MCH: 26.7 pg (ref 26.0–34.0)
MCHC: 33.2 g/dL (ref 30.0–36.0)
MCV: 80.3 fL (ref 80.0–100.0)
Platelets: 375 10*3/uL (ref 150–400)
RBC: 5.89 MIL/uL — ABNORMAL HIGH (ref 4.22–5.81)
RDW: 13 % (ref 11.5–15.5)
WBC: 22.7 10*3/uL — ABNORMAL HIGH (ref 4.0–10.5)
nRBC: 0 % (ref 0.0–0.2)

## 2023-02-08 LAB — URINALYSIS, ROUTINE W REFLEX MICROSCOPIC
Bilirubin Urine: NEGATIVE
Glucose, UA: NEGATIVE mg/dL
Hgb urine dipstick: NEGATIVE
Ketones, ur: 20 mg/dL — AB
Leukocytes,Ua: NEGATIVE
Nitrite: NEGATIVE
Protein, ur: NEGATIVE mg/dL
Specific Gravity, Urine: 1.021 (ref 1.005–1.030)
pH: 5 (ref 5.0–8.0)

## 2023-02-08 LAB — RAPID URINE DRUG SCREEN, HOSP PERFORMED
Amphetamines: NOT DETECTED
Barbiturates: NOT DETECTED
Benzodiazepines: NOT DETECTED
Cocaine: NOT DETECTED
Opiates: NOT DETECTED
Tetrahydrocannabinol: NOT DETECTED

## 2023-02-08 LAB — TROPONIN I (HIGH SENSITIVITY): Troponin I (High Sensitivity): 9 ng/L (ref <18)

## 2023-02-08 LAB — SAMPLE TO BLOOD BANK

## 2023-02-08 LAB — PROTIME-INR
INR: 1.1 (ref 0.8–1.2)
Prothrombin Time: 14.7 s (ref 11.4–15.2)

## 2023-02-08 LAB — T4, FREE: Free T4: 1.02 ng/dL (ref 0.61–1.12)

## 2023-02-08 LAB — ETHANOL: Alcohol, Ethyl (B): <10 mg/dL (ref <10)

## 2023-02-08 LAB — TSH: TSH: 2.622 u[IU]/mL (ref 0.350–4.500)

## 2023-02-08 MED ORDER — LIDOCAINE-EPINEPHRINE (PF) 2 %-1:200000 IJ SOLN
10.0000 mL | Freq: Once | INTRAMUSCULAR | Status: AC
  Administered 2023-02-08: 10 mL
  Filled 2023-02-08: qty 20

## 2023-02-08 MED ORDER — METOPROLOL TARTRATE 5 MG/5ML IV SOLN
5.0000 mg | Freq: Once | INTRAVENOUS | Status: AC
Start: 2023-02-08 — End: 2023-02-08
  Administered 2023-02-08: 5 mg via INTRAVENOUS

## 2023-02-08 MED ORDER — HYDROMORPHONE HCL 1 MG/ML IJ SOLN
1.0000 mg | Freq: Once | INTRAMUSCULAR | Status: AC
  Administered 2023-02-08: 1 mg via INTRAVENOUS
  Filled 2023-02-08: qty 1

## 2023-02-08 MED ORDER — ADENOSINE 6 MG/2ML IV SOLN
6.0000 mg | Freq: Once | INTRAVENOUS | Status: DC
  Filled 2023-02-08: qty 2

## 2023-02-08 MED ORDER — LORAZEPAM 2 MG/ML IJ SOLN
INTRAMUSCULAR | Status: AC
  Administered 2023-02-08: 2 mg via INTRAVENOUS
  Filled 2023-02-08: qty 1

## 2023-02-08 MED ORDER — SODIUM CHLORIDE 0.9 % IV SOLN: INTRAVENOUS | Status: AC

## 2023-02-08 MED ORDER — IOHEXOL 350 MG/ML SOLN
75.0000 mL | Freq: Once | INTRAVENOUS | Status: AC | PRN
  Administered 2023-02-08: 75 mL via INTRAVENOUS

## 2023-02-08 MED ORDER — ONDANSETRON HCL 4 MG/2ML IJ SOLN: 4.0000 mg | Freq: Four times a day (QID) | INTRAMUSCULAR | Status: DC | PRN

## 2023-02-08 MED ORDER — METOPROLOL TARTRATE 12.5 MG HALF TABLET
12.5000 mg | ORAL_TABLET | Freq: Four times a day (QID) | ORAL | Status: DC
  Administered 2023-02-09 (×3): 12.5 mg via ORAL
  Filled 2023-02-08 (×3): qty 1

## 2023-02-08 MED ORDER — LACTATED RINGERS IV BOLUS
1000.0000 mL | Freq: Once | INTRAVENOUS | Status: AC
  Administered 2023-02-08: 1000 mL via INTRAVENOUS

## 2023-02-08 MED ORDER — LORAZEPAM 2 MG/ML IJ SOLN
2.0000 mg | Freq: Once | INTRAMUSCULAR | Status: AC
Start: 2023-02-08 — End: 2023-02-08

## 2023-02-08 MED ORDER — METOPROLOL TARTRATE 5 MG/5ML IV SOLN
5.0000 mg | Freq: Once | INTRAVENOUS | Status: AC
  Administered 2023-02-08: 5 mg via INTRAVENOUS
  Filled 2023-02-08: qty 5

## 2023-02-08 MED ORDER — METOPROLOL TARTRATE 5 MG/5ML IV SOLN
2.5000 mg | INTRAVENOUS | Status: AC
  Administered 2023-02-08 – 2023-02-09 (×4): 2.5 mg via INTRAVENOUS
  Filled 2023-02-08 (×2): qty 5

## 2023-02-08 MED ORDER — ACETAMINOPHEN 325 MG PO TABS
650.0000 mg | ORAL_TABLET | ORAL | Status: DC | PRN
  Administered 2023-02-09 – 2023-02-10 (×6): 650 mg via ORAL
  Filled 2023-02-08 (×6): qty 2

## 2023-02-08 MED ORDER — ENOXAPARIN SODIUM 40 MG/0.4ML IJ SOSY
40.0000 mg | PREFILLED_SYRINGE | INTRAMUSCULAR | Status: DC
  Administered 2023-02-09 – 2023-02-11 (×3): 40 mg via SUBCUTANEOUS
  Filled 2023-02-08 (×3): qty 0.4

## 2023-02-08 MED ORDER — SODIUM CHLORIDE 0.9 % IV BOLUS
1000.0000 mL | Freq: Once | INTRAVENOUS | Status: AC
  Administered 2023-02-08: 1000 mL via INTRAVENOUS

## 2023-02-08 NOTE — Progress Notes (Signed)
 Orthopedic Tech Progress Note Patient Details:  Tyler Ellison May 26, 1985 [id number]  Patient ID: [name], male   DOB: 01-29-1986, 38 y.o.   MRN: [id number] Level 2 trauma Tonye Pearson 02/08/2023, 7:17 PM

## 2023-02-08 NOTE — ED Notes (Signed)
 MD notified of patient HR's; EKG sent in case provider needed repeat; no new orders at this time.

## 2023-02-08 NOTE — ED Provider Notes (Signed)
 Performed laceration repair for Dr. Armenta. 8 stitches placed. Coated vicryl. Instructed patient on return precautions and care. Procedures  .Laceration Repair  Date/Time: 02/08/2023 9:10 PM  Performed by: Philippa Lyle CROME, PA Authorized by: Philippa Lyle CROME, PA   Consent:    Consent obtained:  Verbal   Consent given by:  Patient   Risks, benefits, and alternatives were discussed: yes     Risks discussed:  Need for additional repair, infection, poor wound healing, poor cosmetic result, pain, nerve damage, retained foreign body, tendon damage and vascular damage   Alternatives discussed:  No treatment Universal protocol:    Patient identity confirmed:  Verbally with patient Anesthesia:    Anesthesia method:  Local infiltration   Local anesthetic:  Lidocaine  1% WITH epi Laceration details:    Location:  Face   Face location:  L eyebrow   Length (cm):  2.5 Treatment:    Area cleansed with:  Povidone-iodine   Amount of cleaning:  Standard   Irrigation solution:  Sterile saline Skin repair:    Repair method:  Sutures   Suture size:  5-0   Wound skin closure material used: coated vicryl.   Suture technique:  Simple interrupted   Number of sutures:  8 Approximation:    Approximation:  Close Repair type:    Repair type:  Simple Post-procedure details:    Dressing:  Open (no dressing)   Procedure completion:  [place] Philippa Lyle CROME, PA 02/08/23 2112    Armenta Canning, MD 02/09/23 0001

## 2023-02-08 NOTE — ED Notes (Signed)
 Nehemiah Settle, PA at bedside suturing patient forehead/face
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
## 2023-02-08 NOTE — ED Notes (Signed)
 ED Provider at bedside.

## 2023-02-08 NOTE — ED Notes (Signed)
[facility]ide X[facility]
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
## 2023-02-09 ENCOUNTER — Inpatient Hospital Stay (HOSPITAL_COMMUNITY): Payer: Self-pay

## 2023-02-09 DIAGNOSIS — I4891 Unspecified atrial fibrillation: Secondary | ICD-10-CM

## 2023-02-09 LAB — CBC WITH DIFFERENTIAL/PLATELET
Abs Immature Granulocytes: 0.1 10*3/uL — ABNORMAL HIGH (ref 0.00–0.07)
Basophils Absolute: 0.1 10*3/uL (ref 0.0–0.1)
Basophils Relative: 0 %
Eosinophils Absolute: 0 10*3/uL (ref 0.0–0.5)
Eosinophils Relative: 0 %
HCT: 41.3 % (ref 39.0–52.0)
Hemoglobin: 13.6 g/dL (ref 13.0–17.0)
Immature Granulocytes: 1 %
Lymphocytes Relative: 15 %
Lymphs Abs: 2.5 10*3/uL (ref 0.7–4.0)
MCH: 26.2 pg (ref 26.0–34.0)
MCHC: 32.9 g/dL (ref 30.0–36.0)
MCV: 79.6 fL — ABNORMAL LOW (ref 80.0–100.0)
Monocytes Absolute: 1.5 10*3/uL — ABNORMAL HIGH (ref 0.1–1.0)
Monocytes Relative: 9 %
Neutro Abs: 12.8 10*3/uL — ABNORMAL HIGH (ref 1.7–7.7)
Neutrophils Relative %: 75 %
Platelets: 291 10*3/uL (ref 150–400)
RBC: 5.19 MIL/uL (ref 4.22–5.81)
RDW: 13 % (ref 11.5–15.5)
WBC: 17 10*3/uL — ABNORMAL HIGH (ref 4.0–10.5)
nRBC: 0 % (ref 0.0–0.2)

## 2023-02-09 LAB — ECHOCARDIOGRAM COMPLETE
AR max vel: 4.39 cm2
AV Area VTI: 4.36 cm2
AV Area mean vel: 4.19 cm2
AV Mean grad: 3.7 mm[Hg]
AV Peak grad: 6.1 mm[Hg]
Ao pk vel: 1.23 m/s
Height: 75 in
Weight: 3168 [oz_av]

## 2023-02-09 LAB — COMPREHENSIVE METABOLIC PANEL
ALT: 16 U/L (ref 0–44)
AST: 21 U/L (ref 15–41)
Albumin: 3.4 g/dL — ABNORMAL LOW (ref 3.5–5.0)
Alkaline Phosphatase: 34 U/L — ABNORMAL LOW (ref 38–126)
Anion gap: 8 (ref 5–15)
BUN: 13 mg/dL (ref 6–20)
CO2: 23 mmol/L (ref 22–32)
Calcium: 8.3 mg/dL — ABNORMAL LOW (ref 8.9–10.3)
Chloride: 108 mmol/L (ref 98–111)
Creatinine, Ser: 1.23 mg/dL (ref 0.61–1.24)
GFR, Estimated: >60 mL/min (ref >60)
Glucose, Bld: 103 mg/dL — ABNORMAL HIGH (ref 70–99)
Potassium: 3.9 mmol/L (ref 3.5–5.1)
Sodium: 139 mmol/L (ref 135–145)
Total Bilirubin: 1.6 mg/dL — ABNORMAL HIGH (ref 0.0–1.2)
Total Protein: 5.7 g/dL — ABNORMAL LOW (ref 6.5–8.1)

## 2023-02-09 LAB — TROPONIN I (HIGH SENSITIVITY): Troponin I (High Sensitivity): 8 ng/L (ref <18)

## 2023-02-09 MED ORDER — IBUPROFEN 600 MG PO TABS
600.0000 mg | ORAL_TABLET | Freq: Four times a day (QID) | ORAL | Status: DC | PRN
  Administered 2023-02-09 – 2023-02-10 (×2): 600 mg via ORAL
  Filled 2023-02-09 (×3): qty 1

## 2023-02-09 MED ORDER — AMIODARONE HCL IN DEXTROSE 360-4.14 MG/200ML-% IV SOLN
60.0000 mg/h | INTRAVENOUS | Status: AC
  Administered 2023-02-09 (×2): 60 mg/h via INTRAVENOUS
  Filled 2023-02-09: qty 200

## 2023-02-09 MED ORDER — AMIODARONE HCL IN DEXTROSE 360-4.14 MG/200ML-% IV SOLN
INTRAVENOUS | Status: AC
  Filled 2023-02-09: qty 200

## 2023-02-09 MED ORDER — AMIODARONE LOAD VIA INFUSION
150.0000 mg | Freq: Once | INTRAVENOUS | Status: AC
  Administered 2023-02-09: 150 mg via INTRAVENOUS
  Filled 2023-02-09: qty 83.34

## 2023-02-09 MED ORDER — METOPROLOL SUCCINATE ER 25 MG PO TB24
25.0000 mg | ORAL_TABLET | Freq: Every day | ORAL | Status: DC
  Administered 2023-02-09 – 2023-02-11 (×3): 25 mg via ORAL
  Filled 2023-02-09 (×3): qty 1

## 2023-02-09 MED ORDER — AMIODARONE HCL IN DEXTROSE 360-4.14 MG/200ML-% IV SOLN
30.0000 mg/h | INTRAVENOUS | Status: DC
  Administered 2023-02-09 – 2023-02-10 (×2): 30 mg/h via INTRAVENOUS
  Filled 2023-02-09 (×2): qty 200

## 2023-02-09 MED ORDER — LORAZEPAM 1 MG PO TABS
1.0000 mg | ORAL_TABLET | Freq: Once | ORAL | Status: AC
  Administered 2023-02-09: 1 mg via ORAL
  Filled 2023-02-09: qty 1

## 2023-02-09 NOTE — Plan of Care (Signed)
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
## 2023-02-09 NOTE — Progress Notes (Signed)
   02/09/23 0039  Assess: MEWS Score  Temp 98.3 F (36.8 C)  BP 113/78  MAP (mmHg) 87  Pulse Rate (!) 140  ECG Heart Rate (!) 134  Resp 20  Level of Consciousness Alert  SpO2 98 %  O2 Device Room Air  Patient Activity (if Appropriate) In bed  Assess: MEWS Score  MEWS Temp 0  MEWS Systolic 0  MEWS Pulse 3  MEWS RR 0  MEWS LOC 0  MEWS Score 3  MEWS Score Color Yellow  Assess: if the MEWS score is Yellow or Red  Were vital signs accurate and taken at a resting state? Yes  Does the patient meet 2 or more of the SIRS criteria? Yes  Does the patient have a confirmed or suspected source of infection? No  MEWS guidelines implemented  Yes, yellow  Treat  MEWS Interventions Considered administering scheduled or prn medications/treatments as ordered  Take Vital Signs  Increase Vital Sign Frequency  Yellow: Q2hr x1, continue Q4hrs until patient remains green for 12hrs  Escalate  MEWS: Escalate Yellow: Discuss with charge nurse and consider notifying provider and/or RRT  Notify: Charge Nurse/RN  Name of Charge Nurse/RN Notified [facility]  Provider Notification  Provider Name/Title Dr. Darleene Mace  Date Provider Notified 02/09/23  Time Provider Notified 0111  Method of Notification Page (secure chat)  Notification Reason Other (Comment) (yellow MEWS)  Provider response See new orders  Date of Provider Response 02/09/23  Time of Provider Response 0122  Assess: SIRS CRITERIA  SIRS Temperature  0  SIRS Respirations  0  SIRS Pulse 1  SIRS WBC 1  SIRS Score Sum  2
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
## 2023-02-09 NOTE — Progress Notes (Signed)
*  PRELIMINARY RESULTS* Echocardiogram 2D Echocardiogram has been performed.  Laddie Aquas 02/09/2023, 9:34 AM

## 2023-02-10 ENCOUNTER — Other Ambulatory Visit: Payer: Self-pay | Admitting: Physician Assistant

## 2023-02-10 ENCOUNTER — Other Ambulatory Visit: Payer: Self-pay | Admitting: Cardiology

## 2023-02-10 DIAGNOSIS — I4891 Unspecified atrial fibrillation: Secondary | ICD-10-CM

## 2023-02-10 LAB — BASIC METABOLIC PANEL
Anion gap: 9 (ref 5–15)
BUN: 16 mg/dL (ref 6–20)
CO2: 22 mmol/L (ref 22–32)
Calcium: 8 mg/dL — ABNORMAL LOW (ref 8.9–10.3)
Chloride: 109 mmol/L (ref 98–111)
Creatinine, Ser: 1.06 mg/dL (ref 0.61–1.24)
GFR, Estimated: >60 mL/min (ref >60)
Glucose, Bld: 110 mg/dL — ABNORMAL HIGH (ref 70–99)
Potassium: 4 mmol/L (ref 3.5–5.1)
Sodium: 140 mmol/L (ref 135–145)

## 2023-02-10 LAB — MAGNESIUM: Magnesium: 2.2 mg/dL (ref 1.7–2.4)

## 2023-02-10 MED ORDER — CYCLOBENZAPRINE HCL 10 MG PO TABS
5.0000 mg | ORAL_TABLET | Freq: Three times a day (TID) | ORAL | Status: DC | PRN
  Administered 2023-02-10: 5 mg via ORAL
  Filled 2023-02-10: qty 1

## 2023-02-10 MED ORDER — IBUPROFEN 600 MG PO TABS
800.0000 mg | ORAL_TABLET | Freq: Four times a day (QID) | ORAL | Status: DC | PRN
  Administered 2023-02-10 – 2023-02-11 (×3): 800 mg via ORAL
  Filled 2023-02-10 (×3): qty 1

## 2023-02-10 NOTE — Discharge Instructions (Signed)
 A heart monitor will be mailed to your house to wear. Our office will arrange this.

## 2023-02-10 NOTE — TOC CAGE-AID Note (Signed)
 Transition of Care [facility]) - CAGE-AID Screening  Patient Details  Name: Tyler Ellison MRN: [id number] Date of Birth: [birth year]  Clinical Narrative:  Patient denies any current drug use and states he drinks alcohol very rarely on a special occasion, estimating 1 day/month. Patient is an ever day cigarette smoker. Substance abuse education offered but patient denies need at this time.  CAGE-AID Screening:   Have You Ever Felt You Ought to Cut Down on Your Drinking or Drug Use?: No Have People Annoyed You By Critizing Your Drinking Or Drug Use?: No Have You Felt Bad Or Guilty About Your Drinking Or Drug Use?: No Have You Ever Had a Drink or Used Drugs First Thing In The Morning to Steady Your Nerves or to Get Rid of a Hangover?: No CAGE-AID Score: 0  Substance Abuse Education Offered: No

## 2023-02-10 NOTE — Plan of Care (Signed)
  Problem: Education: Goal: Knowledge of General Education information will improve Description: Including pain rating scale, medication(s)/side effects and non-pharmacologic comfort measures Outcome: Progressing   Problem: Activity: Goal: Risk for activity intolerance will decrease Outcome: Progressing   Problem: Pain Management: Goal: General experience of comfort will improve Outcome: Progressing   Problem: Activity: Goal: Ability to tolerate increased activity will improve Outcome: Progressing   Problem: Health Behavior/Discharge Planning: Goal: Ability to manage health-related needs will improve Outcome: Completed/Met   Problem: Clinical Measurements: Goal: Ability to maintain clinical measurements within normal limits will improve Outcome: Completed/Met Goal: Diagnostic test results will improve Outcome: Completed/Met   Problem: Nutrition: Goal: Adequate nutrition will be maintained Outcome: Completed/Met   Problem: Coping: Goal: Level of anxiety will decrease Outcome: Completed/Met   Problem: Elimination: Goal: Will not experience complications related to bowel motility Outcome: Completed/Met Goal: Will not experience complications related to urinary retention Outcome: Completed/Met
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
## 2023-02-10 NOTE — Plan of Care (Signed)

## 2023-02-10 NOTE — Progress Notes (Unsigned)
 Please contact patient to arrange 14 day Zio XT for atrial fibrillation. Dr. Servando Salina to read. Tereso Newcomer, PA-C    02/10/2023 12:01 PM
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
## 2023-02-11 ENCOUNTER — Inpatient Hospital Stay (INDEPENDENT_AMBULATORY_CARE_PROVIDER_SITE_OTHER): Payer: Self-pay

## 2023-02-11 ENCOUNTER — Other Ambulatory Visit: Payer: Self-pay | Admitting: Physician Assistant

## 2023-02-11 DIAGNOSIS — I4891 Unspecified atrial fibrillation: Secondary | ICD-10-CM

## 2023-02-11 DIAGNOSIS — M25512 Pain in left shoulder: Secondary | ICD-10-CM

## 2023-02-11 MED ORDER — IBUPROFEN 600 MG PO TABS: 600.0000 mg | ORAL_TABLET | Freq: Four times a day (QID) | ORAL | Status: DC | PRN

## 2023-02-11 MED ORDER — METOPROLOL SUCCINATE ER 25 MG PO TB24: 25.0000 mg | ORAL_TABLET | Freq: Every day | ORAL | 3 refills | Status: AC
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
## 2023-02-11 NOTE — TOC Transition Note (Signed)
 Transition of Care [facility]) - Discharge Note   Patient Details  Name: Tyler Ellison MRN: [id number] Date of Birth: September 10, 1985  [facility] CM/SW Contact:  Tom-Johnson, Harvest Muskrat, RN Phone Number: 02/11/2023, 11:08 AM   Clinical Narrative:     Patient is scheduled for discharge today.  Readmission Risk Assessment done. Outpatient referral and order, hospital f/u and discharge instructions on AVS. Mother, Barnie to transport at discharge.  No further TOC needs noted.        Final next level of care: OP Rehab Barriers to Discharge: Barriers Resolved   Patient Goals and CMS Choice Patient states their goals for this hospitalization and ongoing recovery are:: To return home CMS Medicare.gov Compare Post Acute Care list provided to:: Patient Choice offered to / list presented to : NA      Discharge Placement                Patient to be transferred to facility by: Mother Name of family member notified: [facility] [employer] [facility] Additional resources added to the After Visit Summary for                  DME Arranged: N/A DME Agency: NA       HH Arranged: NA HH Agency: NA        Social Drivers of Health (SDOH) Interventions SDOH Screenings   Food Insecurity: No Food Insecurity (02/09/2023)  Housing: Low Risk  (02/09/2023)  Transportation Needs: No Transportation Needs (02/09/2023)  Utilities: Not At Risk (02/09/2023)  Social Connections: Unknown (06/07/2021)   Received from [facility]  Tobacco Use: High Risk (02/08/2023)     Readmission Risk Interventions    02/11/2023   10:59 AM  Readmission Risk Prevention Plan  Post Dischage Appt Complete  Medication Screening Complete  Transportation Screening Complete
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
## 2023-02-11 NOTE — Progress Notes (Unsigned)
 Enrolled for [employer] to mail a ZIO XT long term holter monitor to the patients address on file.   Dr. Harriet Masson to read.

## 2023-02-12 ENCOUNTER — Encounter: Payer: Self-pay | Admitting: Physician Assistant
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
## 2023-02-13 ENCOUNTER — Encounter (HOSPITAL_BASED_OUTPATIENT_CLINIC_OR_DEPARTMENT_OTHER): Payer: Self-pay | Admitting: Orthopaedic Surgery

## 2023-02-13 ENCOUNTER — Encounter: Payer: Self-pay | Admitting: Orthopaedic Surgery

## 2023-02-13 ENCOUNTER — Ambulatory Visit: Payer: Self-pay | Admitting: Orthopaedic Surgery

## 2023-02-13 ENCOUNTER — Other Ambulatory Visit: Payer: Self-pay

## 2023-02-13 DIAGNOSIS — M25512 Pain in left shoulder: Secondary | ICD-10-CM

## 2023-02-13 DIAGNOSIS — S43102A Unspecified dislocation of left acromioclavicular joint, initial encounter: Secondary | ICD-10-CM
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
## 2023-02-13 NOTE — Progress Notes (Signed)
   02/13/23 1440  PAT Phone Screen  Do you have a history of heart problems? (S)  Yes  Cardiologist Name (S)  New onset A fib - in the beginning of cardiac work up- Debbie at Dr Mollie Anger office notifed case will need to be postponed until medically stable and cleared by cardiology or moved to Main or if Urgent.

## 2023-02-18 ENCOUNTER — Encounter (HOSPITAL_COMMUNITY): Payer: Self-pay | Admitting: Orthopaedic Surgery
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
## 2023-02-19 ENCOUNTER — Telehealth: Payer: Self-pay | Admitting: Orthopaedic Surgery

## 2023-02-19 ENCOUNTER — Encounter (HOSPITAL_COMMUNITY): Payer: Self-pay | Admitting: Vascular Surgery

## 2023-02-19 ENCOUNTER — Telehealth: Payer: Self-pay

## 2023-02-19 ENCOUNTER — Other Ambulatory Visit: Payer: Self-pay | Admitting: Physician Assistant

## 2023-02-19 DIAGNOSIS — I4891 Unspecified atrial fibrillation: Secondary | ICD-10-CM

## 2023-02-19 MED ORDER — HYDROCODONE-ACETAMINOPHEN 5-325 MG PO TABS: 1.0000 | ORAL_TABLET | Freq: Three times a day (TID) | ORAL | 0 refills | Status: DC | PRN

## 2023-02-19 MED ORDER — ONDANSETRON HCL 4 MG PO TABS: 4.0000 mg | ORAL_TABLET | Freq: Three times a day (TID) | ORAL | 0 refills | Status: AC | PRN

## 2023-02-19 NOTE — Telephone Encounter (Signed)
   Pre-operative Risk Assessment    Patient Name: Tyler Ellison  DOB: [birth year] MRN: [id number]   Date of last office visit: NONE Date of next office visit: 03/28/2023 Dr. Thomasene Ripple   Request for Surgical Clearance    Procedure:   Left [facility] repair   Date of Surgery:  Clearance 02/20/23                                Surgeon: Dr. Cheral Almas MD Surgeon's Group or Practice Name: [facility]  Phone number:  [phone] Fax number:  [phone] Attn: Eunice Blase   Type of Clearance Requested:   - Medical    Type of Anesthesia:  General    Additional requests/questions:    Luellen Pucker   02/19/2023, 2:20 PM

## 2023-02-19 NOTE — Telephone Encounter (Signed)
Thank you Dr. Servando Salina.

## 2023-02-19 NOTE — Telephone Encounter (Signed)
   Name: Tyler Ellison  DOB: November 07, 1985  MRN: [id number]  Primary Cardiologist: Thomasene Ripple, DO  Chart reviewed as part of pre-operative protocol coverage. Because of Tyler Ellison's past medical history and time since last visit, he will require a follow-up in-office visit in order to better assess preoperative cardiovascular risk.  Pre-op covering staff: - Please schedule appointment and call patient to inform them. If patient already had an upcoming appointment within acceptable timeframe, please add "pre-op clearance" to the appointment notes so provider is aware. - Please contact requesting surgeon's office via preferred method (i.e, phone, fax) to inform them of need for appointment prior to surgery.   Joylene Grapes, NP  02/19/2023, 2:54 PM
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
## 2023-02-19 NOTE — Telephone Encounter (Signed)
Patient's left [facility] repair with Dr. Roda Shutters is cancelled until patient can be cleared by Cardiology.  Patient is requesting something for pain.  Has not been able to sleep.  Cardiology will be calling patient to move appointment up since the surgery is semi-urgent.
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
## 2023-02-19 NOTE — Telephone Encounter (Signed)
Received call from Fayrene Fearing -Pharmacist with [facility] advised received medication request to fill Rx for Tyler Ellison asked what surgery is the patient having?  The number to contact Fayrene Fearing is [phone]

## 2023-02-19 NOTE — Telephone Encounter (Signed)
Called and notified Fayrene Fearing.

## 2023-02-20 ENCOUNTER — Encounter (HOSPITAL_COMMUNITY): Payer: Self-pay | Admitting: Vascular Surgery

## 2023-02-20 ENCOUNTER — Other Ambulatory Visit: Payer: Self-pay | Admitting: Physician Assistant

## 2023-02-20 MED ORDER — TRAMADOL HCL 50 MG PO TABS: 50.0000 mg | ORAL_TABLET | Freq: Every day | ORAL | 0 refills | Status: DC | PRN

## 2023-02-20 NOTE — Telephone Encounter (Signed)
Notified patient.

## 2023-02-20 NOTE — Addendum Note (Signed)
Addended by: Mayra Reel on: 02/20/2023 08:13 AM   Modules accepted: Orders

## 2023-02-20 NOTE — Telephone Encounter (Signed)
I sent in post-op meds yesterday.  He can go ahead and take, but I would take as least as possible so that they help after surgery

## 2023-02-20 NOTE — Telephone Encounter (Signed)
S/w the pt and he haas been added on 02/28/23 schedule with Dr. Servando Salina ok per Dr. Servando Salina to add on. 02/28/23 3:20 Dr. Servando Salina. I will update all parties involved.

## 2023-02-28 ENCOUNTER — Ambulatory Visit: Payer: Self-pay | Attending: Cardiology | Admitting: Cardiology

## 2023-02-28 ENCOUNTER — Encounter: Payer: Self-pay | Admitting: Physician Assistant

## 2023-02-28 ENCOUNTER — Other Ambulatory Visit (HOSPITAL_COMMUNITY): Payer: Self-pay

## 2023-02-28 ENCOUNTER — Encounter: Payer: Self-pay | Admitting: Cardiology

## 2023-02-28 VITALS — BP 142/60 | HR 63 | Ht 75.0 in | Wt 192.0 lb

## 2023-02-28 DIAGNOSIS — R030 Elevated blood-pressure reading, without diagnosis of hypertension: Secondary | ICD-10-CM

## 2023-02-28 DIAGNOSIS — I480 Paroxysmal atrial fibrillation: Secondary | ICD-10-CM

## 2023-02-28 MED ORDER — BLOOD PRESSURE MONITOR AUTOMAT DEVI
1.0000 [IU] | Freq: Once | 0 refills | Status: AC
  Filled 2023-02-28: qty 1, 30d supply, fill #0

## 2023-02-28 MED ORDER — BLOOD PRESSURE MONITOR AUTOMAT DEVI: 1.0000 [IU] | Freq: Once | 0 refills | Status: DC

## 2023-02-28 NOTE — Patient Instructions (Addendum)
Medication Instructions:  Your physician recommends that you continue on your current medications as directed. Please refer to the Current Medication list given to you today.  *If you need a refill on your cardiac medications before your next appointment, please call your pharmacy*   Follow-Up: At [facility], you and your health needs are our priority.  As part of our continuing mission to provide you with exceptional heart care, we have created designated Provider Care Teams.  These Care Teams include your primary Cardiologist (physician) and Advanced Practice Providers (APPs -  Physician Assistants and Nurse Practitioners) who all work together to provide you with the care you need, when you need it.  Your next appointment:   12 week(s)  Provider:   Thomasene Ripple, DO     Other instructions:  HOW TO TAKE YOUR BLOOD PRESSURE: Rest 5 minutes before taking your blood pressure. Don't smoke or drink caffeinated beverages for at least 30 minutes before. Take your blood pressure before (not after) you eat. Sit comfortably with your back supported and both feet on the floor (don't cross your legs). Elevate your arm to heart level on a table or a desk. Use the proper sized cuff. It should fit smoothly and snugly around your bare upper arm. There should be enough room to slip a fingertip under the cuff. The bottom edge of the cuff should be 1 inch above the crease of the elbow. Ideally, take 3 measurements at one sitting and record the average.
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
## 2023-03-07 ENCOUNTER — Other Ambulatory Visit: Payer: Self-pay | Admitting: Physician Assistant

## 2023-03-08 ENCOUNTER — Other Ambulatory Visit: Payer: Self-pay

## 2023-03-08 ENCOUNTER — Encounter (HOSPITAL_COMMUNITY): Payer: Self-pay | Admitting: Orthopaedic Surgery
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
## 2023-03-08 NOTE — Progress Notes (Signed)
 PCP - Denies Cardiologist - Dub Huntsman, DO  Chest x-ray - 02/08/23 EKG - 02/10/23 ECHO - 02/09/23  Anesthesia review: Yes  Patient verbally denies any shortness of breath, fever, cough and chest pain during phone call   -------------  SDW INSTRUCTIONS given:  Your procedure is scheduled on Monday, February 10th.  Report to [facility] Main Entrance A at 1100 A.M., and check in at the Admitting office.  Call this number if you have problems the morning of surgery:  [phone]   Remember:  Do not eat after midnight the night before your surgery  You may drink clear liquids until 1030 the morning of your surgery.   Clear liquids allowed are: Water, Non-Citrus Juices (without pulp), Carbonated Beverages, Clear Tea, Black Coffee Only, and Gatorade    Take these medicines the morning of surgery with A SIP OF WATER  metoprolol  succinate (TOPROL -XL)  acetaminophen  (TYLENOL )-if needed traMADol  (ULTRAM )-if needed  As of today, STOP taking any Aspirin (unless otherwise instructed by your surgeon) Aleve , Naproxen , Ibuprofen , Motrin , Advil , Goody's, BC's, all herbal medications, fish oil, and all vitamins.                      Do not wear jewelry, make up, or nail polish            Do not wear lotions, powders, perfumes/colognes, or deodorant.            Do not shave 48 hours prior to surgery.  Men may shave face and neck.            Do not bring valuables to the hospital.            [facility] is not responsible for any belongings or valuables.  Do NOT Smoke (Tobacco/Vaping) 24 hours prior to your procedure If you use a CPAP at night, you may bring all equipment for your overnight stay.   Contacts, glasses, dentures or bridgework may not be worn into surgery.      For patients admitted to the hospital, discharge time will be determined by your treatment team.   Patients discharged the day of surgery will not be allowed to drive home, and someone needs to stay with them for 24  hours.    Special instructions:   [place]- Preparing For Surgery  Before surgery, you can play an important role. Because skin is not sterile, your skin needs to be as free of germs as possible. You can reduce the number of germs on your skin by washing with CHG (chlorahexidine gluconate) Soap before surgery.  CHG is an antiseptic cleaner which kills germs and bonds with the skin to continue killing germs even after washing.    Oral Hygiene is also important to reduce your risk of infection.  Remember - BRUSH YOUR TEETH THE MORNING OF SURGERY WITH YOUR REGULAR TOOTHPASTE  Please do not use if you have an allergy to CHG or antibacterial soaps. If your skin becomes reddened/irritated stop using the CHG.  Do not shave (including legs and underarms) for at least 48 hours prior to first CHG shower. It is OK to shave your face.  Please follow these instructions carefully.   Shower the NIGHT BEFORE SURGERY and the MORNING OF SURGERY with DIAL Soap.   Pat yourself dry with a CLEAN TOWEL.  Wear CLEAN PAJAMAS to bed the night before surgery  Place CLEAN SHEETS on your bed the night of your first shower and DO NOT SLEEP  WITH PETS.   Day of Surgery: Please shower morning of surgery  Wear Clean/Comfortable clothing the morning of surgery Do not apply any deodorants/lotions.   Remember to brush your teeth WITH YOUR REGULAR TOOTHPASTE.   Questions were answered. Patient verbalized understanding of instructions.

## 2023-03-09 DIAGNOSIS — S43102A Unspecified dislocation of left acromioclavicular joint, initial encounter: Secondary | ICD-10-CM | POA: Insufficient documentation

## 2023-03-09 NOTE — Discharge Instructions (Signed)
 Postoperative instructions:  Weightbearing instructions: weight bearing as tolerated.  Do not lift more than 5 lbs for the first 2 weeks after surgery  Keep your dressing and/or splint clean and dry at all times.  You can remove your dressing on post-operative day #3 and change with a dry/sterile dressing or Band-Aids as needed thereafter.    Incision instructions:  Do not soak your incision for 3 weeks after surgery.  If the incision gets wet, pat dry and do not scrub the incision.  Pain control:  You have been given a prescription to be taken as directed for post-operative pain control.  In addition, elevate the operative extremity above the heart at all times to prevent swelling and throbbing pain.  Take over-the-counter Colace, 100mg  by mouth twice a day while taking narcotic pain medications to help prevent constipation.  Follow up appointments: 1) 14 days for suture removal and wound check. 2) Dr. Jerri as scheduled.   -------------------------------------------------------------------------------------------------------------  After Surgery Pain Control:  After your surgery, post-surgical discomfort or pain is likely. This discomfort can last several days to a few weeks. At certain times of the day your discomfort may be more intense.  Did you receive a nerve block?  A nerve block can provide pain relief for one hour to two days after your surgery. As long as the nerve block is working, you will experience little or no sensation in the area the surgeon operated on.  As the nerve block wears off, you will begin to experience pain or discomfort. It is very important that you begin taking your prescribed pain medication before the nerve block fully wears off. Treating your pain at the first sign of the block wearing off will ensure your pain is better controlled and more tolerable when full-sensation returns. Do not wait until the pain is intolerable, as the medicine will be less  effective. It is better to treat pain in advance than to try and catch up.  General Anesthesia:  If you did not receive a nerve block during your surgery, you will need to start taking your pain medication shortly after your surgery and should continue to do so as prescribed by your surgeon.  Pain Medication:  Most commonly we prescribe Vicodin and Percocet for post-operative pain. Both of these medications contain a combination of acetaminophen  (Tylenol ) and a narcotic to help control pain.   It takes between 30 and 45 minutes before pain medication starts to work. It is important to take your medication before your pain level gets too intense.   Nausea is a common side effect of many pain medications. You will want to eat something before taking your pain medicine to help prevent nausea.   If you are taking a prescription pain medication that contains acetaminophen , we recommend that you do not take additional over the counter acetaminophen  (Tylenol ).  Other pain relieving options:   Using a cold pack to ice the affected area a few times a day (15 to 20 minutes at a time) can help to relieve pain, reduce swelling and bruising.   Elevation of the affected area can also help to reduce pain and swelling.  Per [facility] clinic policy, our goal is ensure optimal postoperative pain control with a multimodal pain management strategy. For all [facility] patients, our goal is to wean post-operative narcotic medications by 6 weeks post-operatively. If this is not possible due to utilization of pain medication prior to surgery, your [facility] doctor will support your acute post-operative pain  control for the first 6 weeks postoperatively, with a plan to transition you back to your primary pain team following that. Maralee will work to ensure a therapist, occupational.

## 2023-03-11 ENCOUNTER — Encounter (HOSPITAL_BASED_OUTPATIENT_CLINIC_OR_DEPARTMENT_OTHER): Payer: Self-pay | Admitting: Vascular Surgery

## 2023-03-11 ENCOUNTER — Other Ambulatory Visit: Payer: Self-pay | Admitting: Physician Assistant

## 2023-03-11 ENCOUNTER — Telehealth: Payer: Self-pay | Admitting: Radiology

## 2023-03-11 ENCOUNTER — Encounter (HOSPITAL_COMMUNITY)
Admission: RE | Disposition: A | Discharge status: Home / Self Care | Payer: Self-pay | Source: Home / Self Care | Attending: Orthopaedic Surgery

## 2023-03-11 ENCOUNTER — Other Ambulatory Visit: Payer: Self-pay

## 2023-03-11 ENCOUNTER — Ambulatory Visit (HOSPITAL_COMMUNITY)
Admission: RE | Admit: 2023-03-11 | Discharge: 2023-03-11 | Disposition: A | Discharge status: Home / Self Care | Payer: Self-pay | Attending: Orthopaedic Surgery | Admitting: Orthopaedic Surgery

## 2023-03-11 ENCOUNTER — Encounter (HOSPITAL_COMMUNITY): Payer: Self-pay | Admitting: Orthopaedic Surgery

## 2023-03-11 ENCOUNTER — Encounter (HOSPITAL_COMMUNITY): Payer: Self-pay | Admitting: Vascular Surgery

## 2023-03-11 ENCOUNTER — Ambulatory Visit (HOSPITAL_COMMUNITY): Payer: Self-pay

## 2023-03-11 DIAGNOSIS — F4310 Post-traumatic stress disorder, unspecified: Secondary | ICD-10-CM | POA: Diagnosis not present

## 2023-03-11 DIAGNOSIS — I10 Essential (primary) hypertension: Secondary | ICD-10-CM | POA: Insufficient documentation

## 2023-03-11 DIAGNOSIS — S43102A Unspecified dislocation of left acromioclavicular joint, initial encounter: Secondary | ICD-10-CM

## 2023-03-11 DIAGNOSIS — I4891 Unspecified atrial fibrillation: Secondary | ICD-10-CM | POA: Insufficient documentation

## 2023-03-11 HISTORY — DX: Post-traumatic stress disorder, unspecified: F43.10

## 2023-03-11 HISTORY — DX: Essential (primary) hypertension: I10

## 2023-03-11 HISTORY — PX: ACROMIO-CLAVICULAR JOINT REPAIR: SHX5183

## 2023-03-11 SURGERY — REPAIR, ACROMIOCLAVICULAR JOINT
Anesthesia: Regional | Site: Shoulder | Laterality: Left

## 2023-03-11 MED ORDER — SODIUM CHLORIDE 0.9 % IR SOLN
Status: DC | PRN
  Administered 2023-03-11: 1000 mL

## 2023-03-11 MED ORDER — OXYCODONE HCL 5 MG PO TABS
5.0000 mg | ORAL_TABLET | Freq: Once | ORAL | Status: AC | PRN
  Administered 2023-03-11: 5 mg via ORAL

## 2023-03-11 MED ORDER — DEXAMETHASONE SODIUM PHOSPHATE 10 MG/ML IJ SOLN
INTRAMUSCULAR | Status: DC | PRN
  Administered 2023-03-11: 10 mg via INTRAVENOUS

## 2023-03-11 MED ORDER — MIDAZOLAM HCL 2 MG/2ML IJ SOLN: 2.0000 mg | Freq: Once | INTRAMUSCULAR | Status: AC

## 2023-03-11 MED ORDER — ROCURONIUM BROMIDE 10 MG/ML (PF) SYRINGE
PREFILLED_SYRINGE | INTRAVENOUS | Status: AC
  Filled 2023-03-11: qty 10

## 2023-03-11 MED ORDER — PHENYLEPHRINE HCL-NACL 20-0.9 MG/250ML-% IV SOLN
INTRAVENOUS | Status: DC | PRN
  Administered 2023-03-11: 50 ug/min via INTRAVENOUS

## 2023-03-11 MED ORDER — MIDAZOLAM HCL 2 MG/2ML IJ SOLN
INTRAMUSCULAR | Status: DC | PRN
  Administered 2023-03-11: 1 mg via INTRAVENOUS

## 2023-03-11 MED ORDER — FENTANYL CITRATE (PF) 100 MCG/2ML IJ SOLN: 100.0000 ug | Freq: Once | INTRAMUSCULAR | Status: AC

## 2023-03-11 MED ORDER — PHENYLEPHRINE 80 MCG/ML (10ML) SYRINGE FOR IV PUSH (FOR BLOOD PRESSURE SUPPORT)
PREFILLED_SYRINGE | INTRAVENOUS | Status: AC
Start: 2023-03-11 — End: ?
  Filled 2023-03-11: qty 10

## 2023-03-11 MED ORDER — ONDANSETRON HCL 4 MG/2ML IJ SOLN
INTRAMUSCULAR | Status: DC | PRN
  Administered 2023-03-11: 4 mg via INTRAVENOUS

## 2023-03-11 MED ORDER — AMISULPRIDE (ANTIEMETIC) 5 MG/2ML IV SOLN: 10.0000 mg | Freq: Once | INTRAVENOUS | Status: DC | PRN

## 2023-03-11 MED ORDER — LACTATED RINGERS IV SOLN: INTRAVENOUS | Status: DC

## 2023-03-11 MED ORDER — BUPIVACAINE LIPOSOME 1.3 % IJ SUSP
INTRAMUSCULAR | Status: DC | PRN
  Administered 2023-03-11: 10 mL via PERINEURAL

## 2023-03-11 MED ORDER — SUGAMMADEX SODIUM 200 MG/2ML IV SOLN
INTRAVENOUS | Status: DC | PRN
  Administered 2023-03-11: 400 mg via INTRAVENOUS

## 2023-03-11 MED ORDER — MEPERIDINE HCL 25 MG/ML IJ SOLN: 6.2500 mg | INTRAMUSCULAR | Status: DC | PRN

## 2023-03-11 MED ORDER — PROPOFOL 10 MG/ML IV BOLUS
INTRAVENOUS | Status: DC | PRN
  Administered 2023-03-11: 200 mg via INTRAVENOUS

## 2023-03-11 MED ORDER — KETOROLAC TROMETHAMINE 30 MG/ML IJ SOLN: 30.0000 mg | Freq: Once | INTRAMUSCULAR | Status: DC | PRN

## 2023-03-11 MED ORDER — TRAMADOL HCL 50 MG PO TABS: 50.0000 mg | ORAL_TABLET | Freq: Four times a day (QID) | ORAL | 0 refills | Status: AC | PRN

## 2023-03-11 MED ORDER — FENTANYL CITRATE (PF) 250 MCG/5ML IJ SOLN
INTRAMUSCULAR | Status: DC | PRN
  Administered 2023-03-11 (×2): 50 ug via INTRAVENOUS

## 2023-03-11 MED ORDER — EPHEDRINE 5 MG/ML INJ
INTRAVENOUS | Status: AC
  Filled 2023-03-11: qty 5

## 2023-03-11 MED ORDER — ORAL CARE MOUTH RINSE: 15.0000 mL | Freq: Once | OROMUCOSAL | Status: AC

## 2023-03-11 MED ORDER — LIDOCAINE 2% (20 MG/ML) 5 ML SYRINGE
INTRAMUSCULAR | Status: AC
  Filled 2023-03-11: qty 5

## 2023-03-11 MED ORDER — CHLORHEXIDINE GLUCONATE 0.12 % MT SOLN
15.0000 mL | Freq: Once | OROMUCOSAL | Status: AC
  Administered 2023-03-11: 15 mL via OROMUCOSAL
  Filled 2023-03-11: qty 15

## 2023-03-11 MED ORDER — ROCURONIUM BROMIDE 10 MG/ML (PF) SYRINGE
PREFILLED_SYRINGE | INTRAVENOUS | Status: DC | PRN
Start: 2023-03-11 — End: 2023-03-11
  Administered 2023-03-11: 20 mg via INTRAVENOUS
  Administered 2023-03-11: 50 mg via INTRAVENOUS

## 2023-03-11 MED ORDER — FENTANYL CITRATE (PF) 100 MCG/2ML IJ SOLN
INTRAMUSCULAR | Status: AC
  Administered 2023-03-11: 100 ug via INTRAVENOUS
  Filled 2023-03-11: qty 2

## 2023-03-11 MED ORDER — HYDROMORPHONE HCL 1 MG/ML IJ SOLN: 0.2500 mg | INTRAMUSCULAR | Status: DC | PRN

## 2023-03-11 MED ORDER — CEFAZOLIN SODIUM-DEXTROSE 2-4 GM/100ML-% IV SOLN
2.0000 g | INTRAVENOUS | Status: AC
  Administered 2023-03-11: 2 g via INTRAVENOUS
  Filled 2023-03-11: qty 100

## 2023-03-11 MED ORDER — ONDANSETRON HCL 4 MG/2ML IJ SOLN
INTRAMUSCULAR | Status: AC
  Filled 2023-03-11: qty 2

## 2023-03-11 MED ORDER — ONDANSETRON HCL 4 MG/2ML IJ SOLN: 4.0000 mg | Freq: Once | INTRAMUSCULAR | Status: DC | PRN

## 2023-03-11 MED ORDER — DEXAMETHASONE SODIUM PHOSPHATE 10 MG/ML IJ SOLN
INTRAMUSCULAR | Status: AC
  Filled 2023-03-11: qty 1

## 2023-03-11 MED ORDER — MIDAZOLAM HCL 2 MG/2ML IJ SOLN
INTRAMUSCULAR | Status: AC
  Administered 2023-03-11: 2 mg via INTRAVENOUS
  Filled 2023-03-11: qty 2

## 2023-03-11 MED ORDER — ROPIVACAINE HCL 5 MG/ML IJ SOLN
INTRAMUSCULAR | Status: DC | PRN
  Administered 2023-03-11: 15 mL via PERINEURAL

## 2023-03-11 MED ORDER — EPHEDRINE SULFATE-NACL 50-0.9 MG/10ML-% IV SOSY
PREFILLED_SYRINGE | INTRAVENOUS | Status: DC | PRN
  Administered 2023-03-11: 10 mg via INTRAVENOUS

## 2023-03-11 MED ORDER — MIDAZOLAM HCL 2 MG/2ML IJ SOLN
INTRAMUSCULAR | Status: AC
  Filled 2023-03-11: qty 2

## 2023-03-11 MED ORDER — KETOROLAC TROMETHAMINE 30 MG/ML IJ SOLN
INTRAMUSCULAR | Status: DC | PRN
  Administered 2023-03-11: 30 mg via INTRAVENOUS

## 2023-03-11 MED ORDER — OXYCODONE HCL 5 MG/5ML PO SOLN: 5.0000 mg | Freq: Once | ORAL | Status: AC | PRN

## 2023-03-11 MED ORDER — HYDROCODONE-ACETAMINOPHEN 5-325 MG PO TABS: 1.0000 | ORAL_TABLET | Freq: Three times a day (TID) | ORAL | 0 refills | Status: AC | PRN

## 2023-03-11 MED ORDER — FENTANYL CITRATE (PF) 250 MCG/5ML IJ SOLN
INTRAMUSCULAR | Status: AC
  Filled 2023-03-11: qty 5

## 2023-03-11 MED ORDER — OXYCODONE HCL 5 MG PO TABS
ORAL_TABLET | ORAL | Status: AC
  Filled 2023-03-11: qty 1

## 2023-03-11 MED ORDER — LIDOCAINE 2% (20 MG/ML) 5 ML SYRINGE
INTRAMUSCULAR | Status: DC | PRN
  Administered 2023-03-11: 60 mg via INTRAVENOUS

## 2023-03-11 SURGICAL SUPPLY — 46 items
BLADE SURG 15 STRL LF DISP TIS (BLADE) ×2 IMPLANT
COOLER ICEMAN CLASSIC (MISCELLANEOUS) IMPLANT
DRAPE C-ARM 42X72 X-RAY (DRAPES) ×1 IMPLANT
DRAPE IMP U-DRAPE 54X76 (DRAPES) ×1 IMPLANT
DRAPE INCISE IOBAN 66X45 STRL (DRAPES) ×1 IMPLANT
DRAPE POUCH INSTRU U-SHP 10X18 (DRAPES) ×1 IMPLANT
DRAPE U-SHAPE 47X51 STRL (DRAPES) ×2 IMPLANT
DRAPE U-SHAPE 76X120 STRL (DRAPES) ×2 IMPLANT
DRSG MEPILEX POST OP 4X8 (GAUZE/BANDAGES/DRESSINGS) ×1 IMPLANT
DRSG TEGADERM 4X4.75 (GAUZE/BANDAGES/DRESSINGS) IMPLANT
DURAPREP 26ML APPLICATOR (WOUND CARE) ×1 IMPLANT
ELECT REM PT RETURN 9FT ADLT (ELECTROSURGICAL) ×1 IMPLANT
ELECTRODE REM PT RTRN 9FT ADLT (ELECTROSURGICAL) ×1 IMPLANT
GAUZE PAD ABD 8X10 STRL (GAUZE/BANDAGES/DRESSINGS) ×1 IMPLANT
GAUZE SPONGE 4X4 12PLY STRL (GAUZE/BANDAGES/DRESSINGS) IMPLANT
GLOVE BIOGEL PI IND STRL 7.5 (GLOVE) ×1 IMPLANT
GLOVE ECLIPSE 7.0 STRL STRAW (GLOVE) ×1 IMPLANT
GLOVE INDICATOR 7.0 STRL GRN (GLOVE) ×1 IMPLANT
GLOVE SURG SYN 7.5 E (GLOVE) ×1 IMPLANT
GLOVE SURG SYN 7.5 PF PI (GLOVE) ×1 IMPLANT
GOWN STRL REUS W/ TWL LRG LVL3 (GOWN DISPOSABLE) ×1 IMPLANT
GOWN STRL SURGICAL XL XLNG (GOWN DISPOSABLE) ×2 IMPLANT
KIT AC JOINT DISP (KITS) IMPLANT
MANIFOLD NEPTUNE II (INSTRUMENTS) ×1 IMPLANT
PACK ARTHROSCOPY DSU (CUSTOM PROCEDURE TRAY) ×1 IMPLANT
PACK BASIN DAY SURGERY FS (CUSTOM PROCEDURE TRAY) ×1 IMPLANT
PENCIL SMOKE EVACUATOR (MISCELLANEOUS) ×1 IMPLANT
RETRIEVER SUT HEWSON (MISCELLANEOUS) ×1 IMPLANT
SHEET MEDIUM DRAPE 40X70 STRL (DRAPES) ×1 IMPLANT
SLEEVE SCD COMPRESS KNEE MED (STOCKING) ×1 IMPLANT
SLING ARM FOAM STRAP LRG (SOFTGOODS) IMPLANT
SPONGE T-LAP 18X18 ~~LOC~~+RFID (SPONGE) ×2 IMPLANT
STRIP CLOSURE SKIN 1/2X4 (GAUZE/BANDAGES/DRESSINGS) IMPLANT
SUCTION TUBE FRAZIER 10FR DISP (SUCTIONS) ×1 IMPLANT
SUT FIBERWIRE #2 38 T-5 BLUE (SUTURE) IMPLANT
SUT MNCRL AB 3-0 PS2 18 (SUTURE) ×1 IMPLANT
SUT MNCRL AB 4-0 PS2 18 (SUTURE) ×1 IMPLANT
SUT VIC AB 0 CT1 27XBRD ANBCTR (SUTURE) ×1 IMPLANT
SUT VIC AB 1 CT1 27XBRD ANBCTR (SUTURE) ×1 IMPLANT
SUT VIC AB 2-0 CT1 TAPERPNT 27 (SUTURE) ×1 IMPLANT
SUTURE FIBERWR #2 38 T-5 BLUE (SUTURE) IMPLANT
SYR BULB EAR ULCER 3OZ GRN STR (SYRINGE) ×1 IMPLANT
TOWEL GREEN STERILE FF (TOWEL DISPOSABLE) ×1 IMPLANT
TUBE CONNECTING 20X1/4 (TUBING) IMPLANT
YANKAUER SUCT BULB TIP NO VENT (SUCTIONS) ×1 IMPLANT
ZIPLOOP AC JOINT REPAIR (Orthopedic Implant) IMPLANT

## 2023-03-11 NOTE — Op Note (Signed)
   Date of Surgery: 03/11/2023  INDICATIONS: Tyler Ellison is a 38 y.o.-year-old male with a left type 5 AC separation.  The patient did consent to the procedure after discussion of the risks and benefits.  PREOPERATIVE DIAGNOSIS: Type 5 left AC separation  POSTOPERATIVE DIAGNOSIS: Same.  PROCEDURE: Repair of left AC separation  SURGEON: N. Claria Crofts, M.D.  ASSIST: Tyler Ellison, Tyler Ellison; necessary for the timely completion of procedure and due to complexity of procedure.  ANESTHESIA:  general and block  IV FLUIDS AND URINE: See anesthesia.  ESTIMATED BLOOD LOSS: minimal mL.  IMPLANTS:  Implant Name Type Inv. Item Serial No. Manufacturer Lot No. LRB No. Used Action  ZIPLOOP AC JOINT REPAIR - [id number] Orthopedic Implant ZIPLOOP AC JOINT REPAIR  ZIMMER RECON(ORTH,TRAU,BIO,SG) [id number] Left 1 Implanted    DRAINS: None  COMPLICATIONS: see description of procedure.  DESCRIPTION OF PROCEDURE: The patient was brought to the operating room.  The patient had been signed prior to the procedure and this was documented. The patient had the anesthesia placed by the anesthesiologist.  A time-out was performed to confirm that this was the correct patient, site, side and location. The patient did receive antibiotics prior to the incision and was re-dosed during the procedure as needed at indicated intervals.  The patient had the operative extremity prepped and draped in the standard surgical fashion.    Bony landmarks were palpated and incision was marked on the skin.  Incision was made from the lateral end of the clavicle towards the coracoid.  Full thickness flaps were raised off of the clavicle.  Traumatic seroma was evacuated from the [facility] joint.  The distal clavicle was significantly superiorly displaced and the [facility] joint capsule was disrupted.  The distal clavicle was still mobile and reducible back to the acromion.  The pec muscle was minimally released from the clavicle in order for me to  palpate the coracoid.  Fluoro was then brought in and a guide pin was inserted in a superior to inferior direction through the clavicle bicortically and then the pin was inserted on oscillate onto the top of the coracoid which was then advanced bicortically.  Care was taken not to plunge through the inferior cortex of the coracoid.  The reamer was then advanced over the guide pain to the proper depth which was verified on fluoro.  Again, care was taken not to plunge.  The reamer and guide pin were both removed and the button was advanced across the tunnels and the button was flipped underneath the coracoid and checked on fluoro.  The superior button was cleated into the zip tight device and tension was applied to reduce the distal clavicle back to the acromion.  This was again checked under fluoro.  I was happy with how well it reduced.  The surgical site was thoroughly irrigated and closed in a usual layered fashion with 0 vicryl, 2.0 vicryl and a running 3.0 moncryl with steri strips.  Sterile dressings were applied.  Shoulder sling placed.  Patient tolerated the procedure well and had no immediate complications.    Tyler Ellison was necessary for opening, closing, retracting, limb positioning and overall facilitation and timely completion of the procedure.  POSTOPERATIVE PLAN: patient will be discharged home. He is to wear the sling when upright.  Follow up in the office in 2 weeks.  Tyler Drape, MD 2:47 PM

## 2023-03-11 NOTE — H&P (Signed)
 PREOPERATIVE H&P  Chief Complaint: acromioclavicular separation  HPI: Tyler Ellison is a 38 y.o. male who presents for surgical treatment of acromioclavicular separation.  He denies any changes in medical history.  History reviewed. No pertinent surgical history. Social History   Socioeconomic History   Marital status: Single    Spouse name: Not on file   Number of children: Not on file   Years of education: Not on file   Highest education level: Not on file  Occupational History   Not on file  Tobacco Use   Smoking status: Some Days    Types: Cigarettes   Smokeless tobacco: Never  Vaping Use   Vaping status: Never Used  Substance and Sexual Activity   Alcohol use: Yes    Comment: occasionally   Drug use: Yes    Types: Marijuana   Sexual activity: Yes    Birth control/protection: Condom  Other Topics Concern   Not on file  Social History Narrative   Not on file   Social Drivers of Health   Financial Resource Strain: Not on file  Food Insecurity: No Food Insecurity (02/09/2023)   Hunger Vital Sign    Worried About Running Out of Food in the Last Year: Never true    Ran Out of Food in the Last Year: Never true  Transportation Needs: No Transportation Needs (02/09/2023)   PRAPARE - Administrator, Civil Service (Medical): No    Lack of Transportation (Non-Medical): No  Physical Activity: Not on file  Stress: Not on file  Social Connections: Unknown (06/07/2021)   Received from [facility]   Social Network    Social Network: Not on file   History reviewed. No pertinent family history. No Known Allergies Prior to Admission medications   Medication Sig Start Date End Date Taking? Authorizing Provider  acetaminophen  (TYLENOL ) 500 MG tablet Take 1,000 mg by mouth every 6 (six) hours as needed for moderate pain (pain score 4-6).   Yes [provider]  HYDROcodone -acetaminophen  (NORCO) 5-325 MG tablet Take 1 tablet by mouth 3 (three) times  daily as needed. To be taken after surgery Patient not taking: Reported on 02/28/2023 02/19/23   Sandie Cross, PA-C  ibuprofen  (ADVIL ) 200 MG tablet Take 300 mg by mouth every 6 (six) hours as needed for moderate pain (pain score 4-6).   Yes [provider]  metoprolol  succinate (TOPROL -XL) 25 MG 24 hr tablet Take 1 tablet (25 mg total) by mouth daily. 02/12/23  Yes Meng, Hao, PA  ondansetron  (ZOFRAN ) 4 MG tablet Take 1 tablet (4 mg total) by mouth every 8 (eight) hours as needed for nausea or vomiting. Patient not taking: Reported on 02/28/2023 02/19/23   Sandie Cross, PA-C  traMADol  (ULTRAM ) 50 MG tablet Take 1-2 tablets (50-100 mg total) by mouth daily as needed. 02/20/23   Wes Hamman, MD     Positive ROS: All other systems have been reviewed and were otherwise negative with the exception of those mentioned in the HPI and as above.  Physical Exam: General: Alert, no acute distress Cardiovascular: No pedal edema Respiratory: No cyanosis, no use of accessory musculature GI: abdomen soft Skin: No lesions in the area of chief complaint Neurologic: Sensation intact distally Psychiatric: Patient is competent for consent with normal mood and affect Lymphatic: no lymphedema  MUSCULOSKELETAL: exam stable  Assessment: acromioclavicular separation  Plan: Plan for Procedure(s): LEFT ACROMIOCLAVICULAR REPAIR  The risks benefits and alternatives were discussed with the patient  including but not limited to the risks of nonoperative treatment, versus surgical intervention including infection, bleeding, nerve injury,  blood clots, cardiopulmonary complications, morbidity, mortality, among others, and they were willing to proceed.   Claria Crofts, MD 03/11/2023 11:46 AM

## 2023-03-11 NOTE — Anesthesia Procedure Notes (Signed)
 Procedure Name: Intubation Date/Time: 03/11/2023 1:45 PM  Performed by: Sherma Diver, CRNAPre-anesthesia Checklist: Patient identified, Patient being monitored, Timeout performed, Emergency Drugs available and Suction available Patient Re-evaluated:Patient Re-evaluated prior to induction Oxygen Delivery Method: Circle system utilized Preoxygenation: Pre-oxygenation with 100% oxygen Induction Type: IV induction Ventilation: Mask ventilation without difficulty Laryngoscope Size: 3 and Miller Grade View: Grade I Tube type: Oral Tube size: 7.5 mm Number of attempts: 1 Airway Equipment and Method: Stylet Placement Confirmation: ETT inserted through vocal cords under direct vision, positive ETCO2 and breath sounds checked- equal and bilateral Secured at: 24 cm Tube secured with: Tape Dental Injury: Teeth and Oropharynx as per pre-operative assessment

## 2023-03-11 NOTE — Telephone Encounter (Signed)
 Pharmacy called stating Heidi Llamas sent in Hydrocodone  today and Dr. Christiane Cowing sent in tramadol . Would like to know if they are to fill one or the other or both.  Per Dr. Christiane Cowing, patient asked for tramadol . Pharmacy advised to fill tramadol  only.

## 2023-03-11 NOTE — Anesthesia Procedure Notes (Signed)
 Anesthesia Regional Block: Interscalene brachial plexus block   Pre-Anesthetic Checklist: , timeout performed,  Correct Patient, Correct Site, Correct Laterality,  Correct Procedure, Correct Position, site marked,  Risks and benefits discussed,  Surgical consent,  Pre-op evaluation,  At surgeon's request and post-op pain management  Laterality: Left  Prep: Maximum Sterile Barrier Precautions used, chloraprep       Needles:  Injection technique: Single-shot  Needle Type: Echogenic Stimulator Needle     Needle Length: 9cm  Needle Gauge: 22     Additional Needles:   Procedures:,,,, ultrasound used (permanent image in chart),,    Narrative:  Start time: 03/11/2023 12:40 PM End time: 03/11/2023 12:45 PM Injection made incrementally with aspirations every 5 mL.  Performed by: Personally  Anesthesiologist: Jacquelyne Matte, DO  Additional Notes: Monitors applied. No increased pain on injection. No increased resistance to injection. Injection made in 5cc increments. Good needle visualization. Patient tolerated procedure well.

## 2023-03-11 NOTE — Transfer of Care (Signed)
 Immediate Anesthesia Transfer of Care Note  Patient: Tyler Ellison  Procedure(s) Performed: LEFT SHOULDER ACROMIOCLAVICULAR REPAIR (Left: Shoulder)  Patient Location: PACU  Anesthesia Type:General  Level of Consciousness: awake, alert , and oriented  Airway & Oxygen Therapy: Patient Spontanous Breathing and Patient connected to nasal cannula oxygen  Post-op Assessment: Report given to RN and Post -op Vital signs reviewed and stable  Post vital signs: Reviewed and stable  Last Vitals:  Vitals Value Taken Time  BP 115/77 03/11/23 1515  Temp    Pulse 54 03/11/23 1526  Resp 20 03/11/23 1526  SpO2 100 % 03/11/23 1526  Vitals shown include unfiled device data.  Last Pain:  Vitals:   03/11/23 1251  PainSc: 0-No pain      Patients Stated Pain Goal: 0 (03/11/23 1251)  Complications: No notable events documented.

## 2023-03-12 ENCOUNTER — Other Ambulatory Visit (HOSPITAL_COMMUNITY): Payer: Self-pay

## 2023-03-12 ENCOUNTER — Telehealth: Payer: Self-pay | Admitting: Orthopaedic Surgery

## 2023-03-12 ENCOUNTER — Encounter (HOSPITAL_COMMUNITY): Payer: Self-pay | Admitting: Orthopaedic Surgery

## 2023-03-12 ENCOUNTER — Encounter: Payer: Self-pay | Admitting: Orthopaedic Surgery

## 2023-03-12 ENCOUNTER — Telehealth: Payer: Self-pay

## 2023-03-12 NOTE — Telephone Encounter (Signed)
Patient states he was supposed to have Tramadol and Hydrocodone but when his mom when to the pharmacy she only picked up the Tramadol. Pt states he was told that he would have Hydrocodone also and his discharge paperwork says Hydrocodone.

## 2023-03-12 NOTE — Anesthesia Postprocedure Evaluation (Signed)
Anesthesia Post Note  Patient: Tyler Ellison  Procedure(s) Performed: LEFT SHOULDER ACROMIOCLAVICULAR REPAIR (Left: Shoulder)     Patient location during evaluation: PACU Anesthesia Type: General Level of consciousness: awake and alert Pain management: pain level controlled Vital Signs Assessment: post-procedure vital signs reviewed and stable Respiratory status: spontaneous breathing, nonlabored ventilation, respiratory function stable and patient connected to nasal cannula oxygen Cardiovascular status: blood pressure returned to baseline and stable Postop Assessment: no apparent nausea or vomiting Anesthetic complications: no   No notable events documented.  Last Vitals:  Vitals:   03/11/23 1515 03/11/23 1600  BP:  113/69  Pulse:  (!) 52  Resp:  17  Temp: 36.6 C 36.6 C  SpO2:  97%    Last Pain:  Vitals:   03/11/23 1545  PainSc: 0-No pain                 Kennieth Rad

## 2023-03-12 NOTE — Telephone Encounter (Signed)
Spoke to patient

## 2023-03-12 NOTE — Telephone Encounter (Signed)
Pharmacy called concerning Rx for Tramadol and Hydrocodone.  Stated that patient advised them today that he was to get both Rx's just in case he didn't like the other.  Would like a call back to clarify?  CB# [phone].  Please advise.  Thank you.

## 2023-03-12 NOTE — Telephone Encounter (Signed)
Notified pharmacy that he can only have 1 or the other. Patient picked up script of Tramadol yesterday. Cannot get hydrocodone as well per Dr.Xu.

## 2023-03-17 ENCOUNTER — Encounter: Payer: Self-pay | Admitting: Cardiology

## 2023-03-19 ENCOUNTER — Ambulatory Visit (INDEPENDENT_AMBULATORY_CARE_PROVIDER_SITE_OTHER): Payer: Self-pay | Admitting: Orthopaedic Surgery

## 2023-03-19 ENCOUNTER — Encounter: Payer: Self-pay | Admitting: Orthopaedic Surgery

## 2023-03-19 DIAGNOSIS — S43102A Unspecified dislocation of left acromioclavicular joint, initial encounter: Secondary | ICD-10-CM

## 2023-03-19 MED ORDER — TRAMADOL HCL 50 MG PO TABS: 50.0000 mg | ORAL_TABLET | Freq: Every day | ORAL | 0 refills | Status: DC | PRN

## 2023-03-19 NOTE — Progress Notes (Signed)
   Post-Op Visit Note   Patient: Tyler Ellison           Date of Birth: 08-Jan-1986           MRN: [id number] Visit Date: 03/19/2023 PCP: Patient, No Pcp Per   Assessment & Plan:  Chief Complaint:  Chief Complaint  Patient presents with   Left Shoulder - Follow-up    Left shoulder AC repair 03/11/2023   Visit Diagnoses:  1. Acromioclavicular separation, left, initial encounter     Plan: Fayez is 1 week status post left AC joint repair.  He is doing well.  Pain is manageable.  Currently on light duty.  Examination of the left shoulder shows intact surgical incision and Steri-Strips.  No signs of infection.  AC joint is symmetric to the other side.  Expected postoperative swelling.  No neurovascular compromise distally.  Will continue light duty work restrictions.  Tramadol refilled.  Recheck in 3 weeks.  Restrictions and limitations to the left upper extremity reviewed.  Follow-Up Instructions: Return in about 3 weeks (around 04/09/2023).   Orders:  No orders of the defined types were placed in this encounter.  Meds ordered this encounter  Medications   traMADol (ULTRAM) 50 MG tablet    Sig: Take 1-2 tablets (50-100 mg total) by mouth daily as needed.    Dispense:  20 tablet    Refill:  0    Imaging: No results found.  PMFS History: Patient Active Problem List   Diagnosis Date Noted   AC separation, type 5, left, initial encounter 03/09/2023   Atrial fibrillation with rapid ventricular response (HCC) 02/08/2023   Past Medical History:  Diagnosis Date   Hypertension    New onset a-fib (HCC) 02/08/2023   Post traumatic stress disorder (PTSD)    Vertigo     No family history on file.  Past Surgical History:  Procedure Laterality Date   ACROMIO-CLAVICULAR JOINT REPAIR Left 03/11/2023   Procedure: LEFT SHOULDER ACROMIOCLAVICULAR REPAIR;  Surgeon: Tarry Kos, MD;  Location: [facility] OR;  Service: Orthopedics;  Laterality: Left;   Social History   Occupational History    Not on file  Tobacco Use   Smoking status: Some Days    Types: Cigarettes   Smokeless tobacco: Never  Vaping Use   Vaping status: Never Used  Substance and Sexual Activity   Alcohol use: Yes    Comment: occasionally   Drug use: Yes    Types: Marijuana   Sexual activity: Yes    Birth control/protection: Condom

## 2023-03-28 ENCOUNTER — Ambulatory Visit: Payer: Self-pay | Attending: Cardiology | Admitting: Cardiology

## 2023-03-28 ENCOUNTER — Other Ambulatory Visit: Payer: Self-pay | Admitting: Orthopaedic Surgery

## 2023-03-28 MED ORDER — TRAMADOL HCL 50 MG PO TABS
50.0000 mg | ORAL_TABLET | Freq: Every day | ORAL | 0 refills | Status: AC | PRN
Start: 2023-03-28 — End: ?

## 2023-04-07 NOTE — Progress Notes (Unsigned)
   Post-Op Visit Note   Patient: Tyler Ellison           Date of Birth: 24-Nov-1985           MRN: [id number] Visit Date: 04/09/2023 PCP: Patient, No Pcp Per   Assessment & Plan:  Chief Complaint: No chief complaint on file. Visit Diagnoses: No diagnosis found.  Plan: ***  Follow-Up Instructions: No follow-ups on file.   Orders:  No orders of the defined types were placed in this encounter. No orders of the defined types were placed in this encounter.  Imaging: No results found.  PMFS History: Patient Active Problem List   Diagnosis Date Noted  . AC separation, type 5, left, initial encounter 03/09/2023  . Atrial fibrillation with rapid ventricular response (HCC) 02/08/2023   Past Medical History:  Diagnosis Date  . Hypertension   . New onset a-fib (HCC) 02/08/2023  . Post traumatic stress disorder (PTSD)   . Vertigo     No family history on file.  Past Surgical History:  Procedure Laterality Date  . ACROMIO-CLAVICULAR JOINT REPAIR Left 03/11/2023   Procedure: LEFT SHOULDER ACROMIOCLAVICULAR REPAIR;  Surgeon: Tarry Kos, MD;  Location: [facility] OR;  Service: Orthopedics;  Laterality: Left;   Social History   Occupational History  . Not on file  Tobacco Use  . Smoking status: Some Days    Types: Cigarettes  . Smokeless tobacco: Never  Vaping Use  . Vaping status: Never Used  Substance and Sexual Activity  . Alcohol use: Yes    Comment: occasionally  . Drug use: Yes    Types: Marijuana  . Sexual activity: Yes    Birth control/protection: Condom

## 2023-04-08 ENCOUNTER — Other Ambulatory Visit: Payer: Self-pay

## 2023-04-09 ENCOUNTER — Ambulatory Visit (INDEPENDENT_AMBULATORY_CARE_PROVIDER_SITE_OTHER): Payer: Self-pay | Admitting: Orthopaedic Surgery

## 2023-04-09 DIAGNOSIS — S43102A Unspecified dislocation of left acromioclavicular joint, initial encounter: Secondary | ICD-10-CM

## 2023-05-07 ENCOUNTER — Ambulatory Visit: Payer: Self-pay | Admitting: Orthopaedic Surgery

## 2023-05-07 ENCOUNTER — Encounter: Payer: Self-pay | Admitting: Orthopaedic Surgery

## 2023-05-07 DIAGNOSIS — S43102A Unspecified dislocation of left acromioclavicular joint, initial encounter: Secondary | ICD-10-CM

## 2023-05-07 NOTE — Progress Notes (Signed)
   Post-Op Visit Note   Patient: Tyler Ellison           Date of Birth: 1985/09/29           MRN: [id number] Visit Date: 05/07/2023 PCP: Patient, No Pcp Per   Assessment & Plan:  Chief Complaint:  Chief Complaint  Patient presents with   Left Shoulder - Follow-up    Left shoulder AC repair 03/11/2023   Visit Diagnoses:  1. AC separation, type 5, left, initial encounter     Plan: [name] is 8 weeks postop from a left AC separation repair.  He is doing well.  He was not not able to attend physical therapy.  He has no complaints.  Examination of the shoulder shows full active and passive range of motion.  Functional strength.  Surgical scar is fully healed.  From my standpoint he is doing great and has recovered well from the surgery.  He is released to activity as tolerated.  Work note provided.  Follow-up as needed.  Follow-Up Instructions: No follow-ups on file.   Orders:  No orders of the defined types were placed in this encounter.  No orders of the defined types were placed in this encounter.   Imaging: No results found.  PMFS History: Patient Active Problem List   Diagnosis Date Noted   AC separation, type 5, left, initial encounter 03/09/2023   Atrial fibrillation with rapid ventricular response (HCC) 02/08/2023   Past Medical History:  Diagnosis Date   Hypertension    New onset a-fib (HCC) 02/08/2023   Post traumatic stress disorder (PTSD)    Vertigo     No family history on file.  Past Surgical History:  Procedure Laterality Date   ACROMIO-CLAVICULAR JOINT REPAIR Left 03/11/2023   Procedure: LEFT SHOULDER ACROMIOCLAVICULAR REPAIR;  Surgeon: Tarry Kos, MD;  Location: [facility] OR;  Service: Orthopedics;  Laterality: Left;   Social History   Occupational History   Not on file  Tobacco Use   Smoking status: Some Days    Types: Cigarettes   Smokeless tobacco: Never  Vaping Use   Vaping status: Never Used  Substance and Sexual Activity   Alcohol use:  Yes    Comment: occasionally   Drug use: Yes    Types: Marijuana   Sexual activity: Yes    Birth control/protection: Condom

## 2023-05-17 ENCOUNTER — Ambulatory Visit: Payer: Self-pay | Attending: Cardiology | Admitting: Cardiology

## 2023-05-17 ENCOUNTER — Other Ambulatory Visit: Payer: Self-pay

## 2023-05-21 ENCOUNTER — Ambulatory Visit: Payer: Self-pay | Attending: Orthopaedic Surgery

## 2023-11-19 ENCOUNTER — Encounter (HOSPITAL_COMMUNITY): Payer: Self-pay | Admitting: *Deleted

## 2023-11-19 ENCOUNTER — Ambulatory Visit (HOSPITAL_COMMUNITY)
Admission: EM | Admit: 2023-11-19 | Discharge: 2023-11-19 | Disposition: A | Discharge status: Home / Self Care | Payer: Self-pay | Attending: Family Medicine | Admitting: Family Medicine

## 2023-11-19 DIAGNOSIS — S90811A Abrasion, right foot, initial encounter: Secondary | ICD-10-CM

## 2023-11-19 DIAGNOSIS — Z23 Encounter for immunization: Secondary | ICD-10-CM

## 2023-11-19 MED ORDER — TETANUS-DIPHTH-ACELL PERTUSSIS 5-2-15.5 LF-MCG/0.5 IM SUSP
0.5000 mL | Freq: Once | INTRAMUSCULAR | Status: AC
  Administered 2023-11-19: 0.5 mL via INTRAMUSCULAR

## 2023-11-19 MED ORDER — TETANUS-DIPHTH-ACELL PERTUSSIS 5-2-15.5 LF-MCG/0.5 IM SUSP
INTRAMUSCULAR | Status: AC
  Filled 2023-11-19: qty 0.5

## 2023-11-19 NOTE — ED Provider Notes (Signed)
 [facility]    CSN: [id number] Arrival date & time: 11/19/23  0802      History   Chief Complaint Chief Complaint  Patient presents with   Wound Check    HPI Tyler Ellison is a 38 y.o. male.    Wound Check  Patient is here for a cut/injury to the bottom of the right foot.  He was at the beach over the weekend.  He scratched his foot on some kinds of sea rocks.  His foot is still swollen,  mild pain/soreness.        Past Medical History:  Diagnosis Date   Hypertension    New onset a-fib (HCC) 02/08/2023   Post traumatic stress disorder (PTSD)    Vertigo     Patient Active Problem List   Diagnosis Date Noted   AC separation, type 5, left, initial encounter 03/09/2023   Atrial fibrillation with rapid ventricular response (HCC) 02/08/2023    Past Surgical History:  Procedure Laterality Date   ACROMIO-CLAVICULAR JOINT REPAIR Left 03/11/2023   Procedure: LEFT SHOULDER ACROMIOCLAVICULAR REPAIR;  Surgeon: Jerri Kay HERO, MD;  Location: [facility] OR;  Service: Orthopedics;  Laterality: Left;       Home Medications    Prior to Admission medications   Medication Sig Start Date End Date Taking? Authorizing Provider  acetaminophen  (TYLENOL ) 500 MG tablet Take 1,000 mg by mouth every 6 (six) hours as needed for moderate pain (pain score 4-6).    [provider]  HYDROcodone -acetaminophen  (NORCO) 5-325 MG tablet Take 1 tablet by mouth 3 (three) times daily as needed. To be taken after surgery 03/11/23   Jule Ronal CROME, PA-C  ibuprofen  (ADVIL ) 200 MG tablet Take 300 mg by mouth every 6 (six) hours as needed for moderate pain (pain score 4-6).    [provider]  metoprolol  succinate (TOPROL -XL) 25 MG 24 hr tablet Take 1 tablet (25 mg total) by mouth daily. 02/12/23   Meng, Hao, PA  ondansetron  (ZOFRAN ) 4 MG tablet Take 1 tablet (4 mg total) by mouth every 8 (eight) hours as needed for nausea or vomiting. 02/19/23   Jule Ronal CROME, PA-C  traMADol   (ULTRAM ) 50 MG tablet Take 1-2 tablets (50-100 mg total) by mouth every 6 (six) hours as needed. 03/11/23   Jerri Kay HERO, MD  traMADol  (ULTRAM ) 50 MG tablet Take 1-2 tablets (50-100 mg total) by mouth daily as needed. 03/28/23   Jule Ronal CROME, PA-C    Family History History reviewed. No pertinent family history.  Social History Social History   Tobacco Use   Smoking status: Some Days    Types: Cigarettes   Smokeless tobacco: Never  Vaping Use   Vaping status: Never Used  Substance Use Topics   Alcohol use: Yes    Comment: occasionally   Drug use: Yes    Types: Marijuana     Allergies   Patient has no known allergies.   Review of Systems Review of Systems  Constitutional: Negative.   HENT: Negative.    Respiratory: Negative.    Cardiovascular: Negative.   Gastrointestinal: Negative.   Musculoskeletal: Negative.   Skin:  Positive for wound.     Physical Exam Triage Vital Signs ED Triage Vitals  Encounter Vitals Group     BP 11/19/23 0814 124/83     Girls Systolic BP Percentile --      Girls Diastolic BP Percentile --      Boys Systolic BP Percentile --  Boys Diastolic BP Percentile --      Pulse Rate 11/19/23 0814 87     Resp 11/19/23 0814 16     Temp 11/19/23 0814 97.8 F (36.6 C)     Temp Source 11/19/23 0814 Oral     SpO2 11/19/23 0814 97 %     Weight --      Height --      Head Circumference --      Peak Flow --      Pain Score 11/19/23 0813 3     Pain Loc --      Pain Education --      Exclude from Growth Chart --    No data found.  Updated Vital Signs BP 124/83 (BP Location: Left Arm)   Pulse 87   Temp 97.8 F (36.6 C) (Oral)   Resp 16   SpO2 97%   Visual Acuity Right Eye Distance:   Left Eye Distance:   Bilateral Distance:    Right Eye Near:   Left Eye Near:    Bilateral Near:     Physical Exam Constitutional:      General: He is not in acute distress.    Appearance: Normal appearance. He is normal weight. He is not  ill-appearing or toxic-appearing.  Skin:    Comments: At the bottom of the right foot, near the base of the 1st and 2nd toe are areas of abrasions;  there is minimal erythema;  mild tenderness;  no streaking noted;  no drainage noted.   Neurological:     General: No focal deficit present.     Mental Status: He is alert.  Psychiatric:        Mood and Affect: Mood normal.      UC Treatments / Results  Labs (all labs ordered are listed, but only abnormal results are displayed) Labs Reviewed - No data to display  EKG   Radiology No results found.  Procedures Procedures (including critical care time)  Medications Ordered in UC Medications - No data to display  Initial Impression / Assessment and Plan / UC Course  I have reviewed the triage vital signs and the nursing notes.  Pertinent labs & imaging results that were available during my care of the patient were reviewed by me and considered in my medical decision making (see chart for details).   Final Clinical Impressions(s) / UC Diagnoses   Final diagnoses:  Abrasion of right foot, initial encounter     Discharge Instructions      You were seen today for a foot abrasion.  I have given you a Tdap today, which is good for 10 years.  I recommend you keep the wound covered, and use bacitracin  antibiotic ointment as well (rather than neosporin).  I do recommend you attempt to stay off of your foot if possible.  Return if not improving or worsening.     ED Prescriptions   None    PDMP not reviewed this encounter.   Darral Longs, MD 11/19/23 [phone]

## 2023-11-19 NOTE — ED Triage Notes (Signed)
 Pt states [place] was out [place] the country and cut the bottom of his left foot on something in the ocean Saturday. He has been using neosporin on the area. Last TDAP unknown

## 2023-11-19 NOTE — Discharge Instructions (Signed)
 You were seen today for a foot abrasion.  I have given you a Tdap today, which is good for 10 years.  I recommend you keep the wound covered, and use bacitracin  antibiotic ointment as well (rather than neosporin).  I do recommend you attempt to stay off of your foot if possible.  Return if not improving or worsening.

## 2023-12-02 ENCOUNTER — Encounter: Payer: Self-pay | Admitting: Radiology
